# Patient Record
Sex: Male | Born: 1970 | Race: Black or African American | Hispanic: No | Marital: Single | Smoking: Former smoker
Health system: Southern US, Community
[De-identification: ages and names within clinical notes are randomized; demographics above are authoritative.]

## PROBLEM LIST (undated history)

## (undated) DIAGNOSIS — R001 Bradycardia, unspecified: Secondary | ICD-10-CM

## (undated) DIAGNOSIS — M1990 Unspecified osteoarthritis, unspecified site: Secondary | ICD-10-CM

## (undated) DIAGNOSIS — H409 Unspecified glaucoma: Secondary | ICD-10-CM

## (undated) DIAGNOSIS — R519 Headache, unspecified: Secondary | ICD-10-CM

## (undated) DIAGNOSIS — K219 Gastro-esophageal reflux disease without esophagitis: Secondary | ICD-10-CM

## (undated) HISTORY — PX: EYE SURGERY: SHX253

## (undated) HISTORY — PX: FRACTURE SURGERY: SHX138

---

## 2004-09-06 ENCOUNTER — Emergency Department: Payer: Self-pay | Admitting: Emergency Medicine

## 2004-09-25 ENCOUNTER — Ambulatory Visit: Payer: Self-pay

## 2006-04-07 ENCOUNTER — Emergency Department: Payer: Self-pay | Admitting: Emergency Medicine

## 2007-03-22 ENCOUNTER — Other Ambulatory Visit: Payer: Self-pay

## 2007-03-22 ENCOUNTER — Emergency Department: Payer: Self-pay | Admitting: Internal Medicine

## 2007-04-02 ENCOUNTER — Emergency Department: Payer: Self-pay | Admitting: Unknown Physician Specialty

## 2007-12-24 ENCOUNTER — Emergency Department: Payer: Self-pay | Admitting: Emergency Medicine

## 2008-02-02 ENCOUNTER — Other Ambulatory Visit: Payer: Self-pay

## 2008-02-02 ENCOUNTER — Emergency Department: Payer: Self-pay | Admitting: Emergency Medicine

## 2012-12-03 ENCOUNTER — Emergency Department: Payer: Self-pay | Admitting: Internal Medicine

## 2013-05-03 ENCOUNTER — Emergency Department: Payer: Self-pay | Admitting: Emergency Medicine

## 2014-08-02 ENCOUNTER — Emergency Department: Payer: Self-pay | Admitting: Emergency Medicine

## 2014-08-02 LAB — BASIC METABOLIC PANEL
ANION GAP: 8 (ref 7–16)
BUN: 17 mg/dL
CALCIUM: 9.3 mg/dL
Chloride: 108 mmol/L
Co2: 24 mmol/L
Creatinine: 1.25 mg/dL — ABNORMAL HIGH
EGFR (Non-African Amer.): >60
Glucose: 107 mg/dL — ABNORMAL HIGH
Potassium: 3.9 mmol/L
Sodium: 140 mmol/L

## 2014-08-02 LAB — CBC
HCT: 42.7 % (ref 40.0–52.0)
HGB: 14.2 g/dL (ref 13.0–18.0)
MCH: 29.9 pg (ref 26.0–34.0)
MCHC: 33.2 g/dL (ref 32.0–36.0)
MCV: 90 fL (ref 80–100)
Platelet: 165 10*3/uL (ref 150–440)
RBC: 4.73 10*6/uL (ref 4.40–5.90)
RDW: 14.5 % (ref 11.5–14.5)
WBC: 5 10*3/uL (ref 3.8–10.6)

## 2014-08-02 LAB — TROPONIN I

## 2014-08-02 LAB — PRO B NATRIURETIC PEPTIDE: B-Type Natriuretic Peptide: 39 pg/mL

## 2015-05-20 ENCOUNTER — Encounter: Payer: Self-pay | Admitting: Emergency Medicine

## 2015-05-20 ENCOUNTER — Emergency Department
Admission: EM | Admit: 2015-05-20 | Discharge: 2015-05-20 | Disposition: A | Discharge status: Home / Self Care | Payer: BLUE CROSS/BLUE SHIELD | Attending: Emergency Medicine | Admitting: Emergency Medicine

## 2015-05-20 DIAGNOSIS — X58XXXA Exposure to other specified factors, initial encounter: Secondary | ICD-10-CM | POA: Diagnosis not present

## 2015-05-20 DIAGNOSIS — Y9289 Other specified places as the place of occurrence of the external cause: Secondary | ICD-10-CM | POA: Insufficient documentation

## 2015-05-20 DIAGNOSIS — S39012A Strain of muscle, fascia and tendon of lower back, initial encounter: Secondary | ICD-10-CM

## 2015-05-20 DIAGNOSIS — Y998 Other external cause status: Secondary | ICD-10-CM | POA: Diagnosis not present

## 2015-05-20 DIAGNOSIS — Z87891 Personal history of nicotine dependence: Secondary | ICD-10-CM | POA: Insufficient documentation

## 2015-05-20 DIAGNOSIS — Y9389 Activity, other specified: Secondary | ICD-10-CM | POA: Insufficient documentation

## 2015-05-20 DIAGNOSIS — S3992XA Unspecified injury of lower back, initial encounter: Secondary | ICD-10-CM | POA: Diagnosis present

## 2015-05-20 MED ORDER — IBUPROFEN 800 MG PO TABS: 800.0000 mg | ORAL_TABLET | Freq: Three times a day (TID) | ORAL | Status: DC | PRN

## 2015-05-20 MED ORDER — OXYCODONE-ACETAMINOPHEN 5-325 MG PO TABS
1.0000 | ORAL_TABLET | Freq: Once | ORAL | Status: AC
Start: 2015-05-20 — End: 2015-05-20
  Administered 2015-05-20: 1 via ORAL
  Filled 2015-05-20: qty 1

## 2015-05-20 MED ORDER — METHOCARBAMOL 500 MG PO TABS
1000.0000 mg | ORAL_TABLET | Freq: Once | ORAL | Status: AC
  Administered 2015-05-20: 1000 mg via ORAL
  Filled 2015-05-20: qty 2

## 2015-05-20 MED ORDER — IBUPROFEN 800 MG PO TABS
800.0000 mg | ORAL_TABLET | Freq: Once | ORAL | Status: AC
  Administered 2015-05-20: 800 mg via ORAL
  Filled 2015-05-20: qty 1

## 2015-05-20 MED ORDER — METHOCARBAMOL 750 MG PO TABS: 1500.0000 mg | ORAL_TABLET | Freq: Four times a day (QID) | ORAL | Status: DC

## 2015-05-20 MED ORDER — OXYCODONE-ACETAMINOPHEN 5-325 MG PO TABS: 1.0000 | ORAL_TABLET | Freq: Four times a day (QID) | ORAL | Status: DC | PRN

## 2015-05-20 NOTE — ED Provider Notes (Signed)
[facility] Emergency Department Provider Note  ____________________________________________  Time seen: Approximately 10:02 AM  I have reviewed the triage vital signs and the nursing notes.   HISTORY  Chief Complaint Back Pain    HPI Maurice Perez is a 45 y.o. male patient's complaining low back pain persisting muscle spasm bilateral lumbar area. Incident occurred approximately 9 hours ago. He denies any provocative incident for this complaint. Patient denies any radicular component to this pain he denies any bladder or bowel dysfunction. Patient stated intermittent episodes over the past 3 years last episode approximately 6 months ago. Patient states no acute findings on imaging done in the past year. No palliative measures taken for this complaint. Patient is rating his pain as a 10 over 10 describe the pain as "spasmatic".  History reviewed. No pertinent past medical history.  There are no active problems to display for this patient.   History reviewed. No pertinent past surgical history.  Current Outpatient Rx  Name  Route  Sig  Dispense  Refill  . ibuprofen (ADVIL,MOTRIN) 800 MG tablet   Oral   Take 1 tablet (800 mg total) by mouth every 8 (eight) hours as needed.   30 tablet   0   . methocarbamol (ROBAXIN-750) 750 MG tablet   Oral   Take 2 tablets (1,500 mg total) by mouth 4 (four) times daily.   40 tablet   0   . oxyCODONE-acetaminophen (ROXICET) 5-325 MG tablet   Oral   Take 1 tablet by mouth every 6 (six) hours as needed for moderate pain.   12 tablet   0     Allergies Review of patient's allergies indicates no known allergies.  History reviewed. No pertinent family history.  Social History Social History  Substance Use Topics  . Smoking status: Former Games developer  . Smokeless tobacco: Never Used  . Alcohol Use: No    Review of Systems Constitutional: No fever/chills Eyes: No visual changes. ENT: No sore  throat. Cardiovascular: Denies chest pain. Respiratory: Denies shortness of breath. Gastrointestinal: No abdominal pain.  No nausea, no vomiting.  No diarrhea.  No constipation. Genitourinary: Negative for dysuria. Musculoskeletal: Positive for back pain. Skin: Negative for rash. Neurological: Negative for headaches, focal weakness or numbness. 10-point ROS otherwise negative.  ____________________________________________   PHYSICAL EXAM:  VITAL SIGNS: ED Triage Vitals  Enc Vitals Group     BP 05/20/15 0912 139/69 mmHg     Pulse Rate 05/20/15 0912 84     Resp 05/20/15 0912 20     Temp 05/20/15 0912 97.6 F (36.4 C)     Temp Source 05/20/15 0912 Oral     SpO2 05/20/15 0912 100 %     Weight 05/20/15 0912 150 lb (68.04 kg)     Height 05/20/15 0912 5\' 5"  (1.651 m)     Head Cir --      Peak Flow --      Pain Score 05/20/15 0913 10     Pain Loc --      Pain Edu? --      Excl. in GC? --     Constitutional: Alert and oriented. Well appearing and in no acute distress. Eyes: Conjunctivae are normal. PERRL. EOMI. Head: Atraumatic. Nose: No congestion/rhinnorhea. Mouth/Throat: Mucous membranes are moist.  Oropharynx non-erythematous. Neck: No stridor.  No cervical spine tenderness to palpation. Hematological/Lymphatic/Immunilogical: No cervical lymphadenopathy. Cardiovascular: Normal rate, regular rhythm. Grossly normal heart sounds.  Good peripheral circulation. Respiratory: Normal respiratory effort.  No  retractions. Lungs CTAB. Gastrointestinal: Soft and nontender. No distention. No abdominal bruits. No CVA tenderness. Musculoskeletal: No obvious spinal deformity. Mild guarding palpation spinal processes. No CVA guarding. Patient decreased range of motion with flexion. Upon returning to extension palpable paraspinal muscle spasm bilaterally. Patient has negative straight leg test. Neurologic:  Normal speech and language. No gross focal neurologic deficits are appreciated. No gait  instability. Skin:  Skin is warm, dry and intact. No rash noted. Psychiatric: Mood and affect are normal. Speech and behavior are normal.  ____________________________________________   LABS (all labs ordered are listed, but only abnormal results are displayed)  Labs Reviewed - No data to display ____________________________________________  EKG   ____________________________________________  RADIOLOGY   ____________________________________________   PROCEDURES  Procedure(s) performed: None  Critical Care performed: No  ____________________________________________   INITIAL IMPRESSION / ASSESSMENT AND PLAN / ED COURSE  Pertinent labs & imaging results that were available during my care of the patient were reviewed by me and considered in my medical decision making (see chart for details).  Acute lumbar strain. Patient given discharge care instructions.Given a work note for 2 days. ____________________________________________   FINAL CLINICAL IMPRESSION(S) / ED DIAGNOSES  Final diagnoses:  Lumbar strain, initial encounter      Joni ReiningRonald K Sergio Hobart, PA-C 05/20/15 1019  Emily FilbertJonathan E Williams, MD 05/20/15 1024

## 2015-05-20 NOTE — ED Notes (Signed)
States he is having lower back pain since early am  Denies any injury.. And states pain is non radiating  Hx of same

## 2015-05-20 NOTE — ED Notes (Signed)
Pt to triage c/o back pain that started at 0100 today. Pt has chronic back pain. Denies injury.

## 2015-05-21 ENCOUNTER — Emergency Department: Payer: BLUE CROSS/BLUE SHIELD

## 2015-05-21 ENCOUNTER — Encounter: Payer: Self-pay | Admitting: Emergency Medicine

## 2015-05-21 ENCOUNTER — Emergency Department
Admission: EM | Admit: 2015-05-21 | Discharge: 2015-05-21 | Disposition: A | Discharge status: Home / Self Care | Payer: BLUE CROSS/BLUE SHIELD | Attending: Emergency Medicine | Admitting: Emergency Medicine

## 2015-05-21 DIAGNOSIS — Y9389 Activity, other specified: Secondary | ICD-10-CM | POA: Insufficient documentation

## 2015-05-21 DIAGNOSIS — Y998 Other external cause status: Secondary | ICD-10-CM | POA: Insufficient documentation

## 2015-05-21 DIAGNOSIS — M47816 Spondylosis without myelopathy or radiculopathy, lumbar region: Secondary | ICD-10-CM | POA: Diagnosis not present

## 2015-05-21 DIAGNOSIS — Z79899 Other long term (current) drug therapy: Secondary | ICD-10-CM | POA: Insufficient documentation

## 2015-05-21 DIAGNOSIS — X58XXXA Exposure to other specified factors, initial encounter: Secondary | ICD-10-CM | POA: Diagnosis not present

## 2015-05-21 DIAGNOSIS — Y9289 Other specified places as the place of occurrence of the external cause: Secondary | ICD-10-CM | POA: Diagnosis not present

## 2015-05-21 DIAGNOSIS — S39012A Strain of muscle, fascia and tendon of lower back, initial encounter: Secondary | ICD-10-CM | POA: Insufficient documentation

## 2015-05-21 DIAGNOSIS — Z87891 Personal history of nicotine dependence: Secondary | ICD-10-CM | POA: Insufficient documentation

## 2015-05-21 DIAGNOSIS — M545 Low back pain: Secondary | ICD-10-CM | POA: Diagnosis present

## 2015-05-21 NOTE — ED Notes (Signed)
Patient seen here yesterday for lower back pain. Returns this AM with same complaint, states that he "hurt all night" and that pain medication is not helping him. Denies any new symptoms. Pain radiates down left leg.

## 2015-05-21 NOTE — ED Notes (Signed)
Was seen for lower back pain yesterday. Now states is worse and radiates into right leg

## 2015-05-21 NOTE — ED Provider Notes (Signed)
CSN: [id number]     Arrival date & time 05/21/15  1007 History   First MD Initiated Contact with Patient 05/21/15 1039     Chief Complaint  Patient presents with  . Back Pain    HPI Comments: 45 year old male presents today complaining of lower back pain radiating down both legs. Has not had any tingling, numbness or weakness down his legs. No loss of bowel or bladder function, genital or perianal numbness. Seen here yesterday for the same and prescribed motrin, robaxin and percocet without relief. Does not have a history of back problems and has never had a surgery. This pain started about 3 days ago without an injury/   The history is provided by the patient.    History reviewed. No pertinent past medical history. History reviewed. No pertinent past surgical history. No family history on file. Social History  Substance Use Topics  . Smoking status: Former Games developermoker  . Smokeless tobacco: Never Used  . Alcohol Use: No    Review of Systems  Musculoskeletal: Positive for myalgias, back pain and arthralgias. Negative for gait problem.  Neurological: Negative for weakness and numbness.  All other systems reviewed and are negative.     Allergies  Review of patient's allergies indicates no known allergies.  Home Medications   Prior to Admission medications   Medication Sig Start Date End Date Taking? Authorizing Provider  ibuprofen (ADVIL,MOTRIN) 800 MG tablet Take 1 tablet (800 mg total) by mouth every 8 (eight) hours as needed. 05/20/15   Joni Reiningonald K Smith, PA-C  methocarbamol (ROBAXIN-750) 750 MG tablet Take 2 tablets (1,500 mg total) by mouth 4 (four) times daily. 05/20/15   Joni Reiningonald K Smith, PA-C  oxyCODONE-acetaminophen (ROXICET) 5-325 MG tablet Take 1 tablet by mouth every 6 (six) hours as needed for moderate pain. 05/20/15   Joni Reiningonald K Smith, PA-C   BP 120/63 mmHg  Pulse 54  Temp(Src) 98.1 F (36.7 C) (Oral)  Resp 12  Ht 5\' 5"  (1.651 m)  Wt 68.04 kg  BMI 24.96 kg/m2  SpO2  98% Physical Exam  Constitutional: He is oriented to person, place, and time. Vital signs are normal. He appears well-developed and well-nourished. He is active.  Non-toxic appearance. He does not have a sickly appearance. He does not appear ill.  HENT:  Head: Normocephalic and atraumatic.  Neck: Normal range of motion. Neck supple.  Musculoskeletal:       Right hip: Normal. He exhibits normal strength and no tenderness.       Left hip: Normal. He exhibits normal strength and no tenderness.       Lumbar back: He exhibits tenderness and spasm.  Neurological: He is alert and oriented to person, place, and time. He has normal strength and normal reflexes. He displays normal reflexes. No sensory deficit. He exhibits normal muscle tone. Gait normal.  Reflex Scores:      Patellar reflexes are 2+ on the right side and 2+ on the left side. No ataxia  Negative SLr bilaterally   Skin: Skin is warm and dry.  Psychiatric: He has a normal mood and affect. His behavior is normal. Thought content normal.  Nursing note and vitals reviewed.   ED Course  Procedures (including critical care time) Labs Review Labs Reviewed - No data to display  Imaging Review Dg Lumbar Spine 2-3 Views  05/21/2015  CLINICAL DATA:  Lower back pain for 2 days traveling down both legs. EXAM: LUMBAR SPINE - 2-3 VIEW COMPARISON:  12/03/2012 FINDINGS:  No evidence of fracture, focal bone lesion, or endplate erosion. Stable spondylotic endplate spurring with mild L1-2 and L4-5 disc narrowing. No subluxation or visible facet arthropathy. IMPRESSION: No acute finding or change from 2014. Electronically Signed   By: Marnee Spring M.D.   On: 05/21/2015 11:44   I have personally reviewed and evaluated these images and lab results as part of my medical decision-making.   EKG Interpretation None      MDM  Reviewed notes from yesterday - non traumatic back pain , taking meds without relief. Will perform XRAY today. No red flag  warning signs to indicate need for MRI   XRAY as above, unchanged from prior - continue current meds follow up for new or worsening symptoms  Final diagnoses:  Lumbar strain, initial encounter  Arthritis, lumbar spine        Christella Scheuermann, PA-C 05/21/15 1153  Minna Antis, MD 05/21/15 1530

## 2016-08-30 ENCOUNTER — Encounter: Payer: Self-pay | Admitting: Emergency Medicine

## 2016-08-30 ENCOUNTER — Emergency Department
Admission: EM | Admit: 2016-08-30 | Discharge: 2016-08-30 | Disposition: A | Discharge status: Home / Self Care | Payer: BLUE CROSS/BLUE SHIELD | Attending: Emergency Medicine | Admitting: Emergency Medicine

## 2016-08-30 DIAGNOSIS — Z87891 Personal history of nicotine dependence: Secondary | ICD-10-CM | POA: Insufficient documentation

## 2016-08-30 DIAGNOSIS — Z79899 Other long term (current) drug therapy: Secondary | ICD-10-CM | POA: Diagnosis not present

## 2016-08-30 DIAGNOSIS — J029 Acute pharyngitis, unspecified: Secondary | ICD-10-CM | POA: Diagnosis not present

## 2016-08-30 LAB — POCT RAPID STREP A: STREPTOCOCCUS, GROUP A SCREEN (DIRECT): NEGATIVE

## 2016-08-30 MED ORDER — AMOXICILLIN 500 MG PO CAPS
500.0000 mg | ORAL_CAPSULE | Freq: Once | ORAL | Status: AC
  Administered 2016-08-30: 500 mg via ORAL
  Filled 2016-08-30: qty 1

## 2016-08-30 MED ORDER — AMOXICILLIN 500 MG PO TABS: 500.0000 mg | ORAL_TABLET | Freq: Three times a day (TID) | ORAL | 0 refills | Status: DC

## 2016-08-30 NOTE — ED Provider Notes (Signed)
[facility] Emergency Department Provider Note  ____________________________________________  Time seen: Approximately 7:28 PM  I have reviewed the triage vital signs and the nursing notes.   HISTORY  Chief Complaint Sore Throat    HPI Maurice Perez is a 46 y.o. male who presents to the emergency department for evaluation of sore throat x 3 days. He reports chills without known fever. He has taken tylenol without relief.  History reviewed. No pertinent past medical history.  There are no active problems to display for this patient.   Past Surgical History:  Procedure Laterality Date  . FRACTURE SURGERY Left    LLE    Prior to Admission medications   Medication Sig Start Date End Date Taking? Authorizing Provider  amoxicillin (AMOXIL) 500 MG tablet Take 1 tablet (500 mg total) by mouth 3 (three) times daily. 08/30/16   Chinita Pester, FNP  ibuprofen (ADVIL,MOTRIN) 800 MG tablet Take 1 tablet (800 mg total) by mouth every 8 (eight) hours as needed. 05/20/15   Joni Reining, PA-C  methocarbamol (ROBAXIN-750) 750 MG tablet Take 2 tablets (1,500 mg total) by mouth 4 (four) times daily. 05/20/15   Joni Reining, PA-C  oxyCODONE-acetaminophen (ROXICET) 5-325 MG tablet Take 1 tablet by mouth every 6 (six) hours as needed for moderate pain. 05/20/15   Joni Reining, PA-C    Allergies Patient has no known allergies.  History reviewed. No pertinent family history.  Social History Social History  Substance Use Topics  . Smoking status: Former Games developer  . Smokeless tobacco: Never Used  . Alcohol use No    Review of Systems Constitutional: Negative for fever. Positive for chills. Eyes: No visual changes. ENT: Positive for sore throat; Negative for difficulty swallowing. Respiratory: Denies shortness of breath. Gastrointestinal: No abdominal pain.  No nausea, no vomiting.  No diarrhea.  Genitourinary: Negative for dysuria. Musculoskeletal: Positive for  generalized body aches. Skin: Negative for rash. Neurological: Negative for headaches, focal weakness or numbness.  ____________________________________________   PHYSICAL EXAM:  VITAL SIGNS: ED Triage Vitals [08/30/16 1835]  Enc Vitals Group     BP 128/79     Pulse Rate 90     Resp 18     Temp 97.7 F (36.5 C)     Temp Source Oral     SpO2 97 %     Weight 160 lb (72.6 kg)     Height  (1.651 m)     Head Circumference      Peak Flow      Pain Score 7     Pain Loc      Pain Edu?      Excl. in GC?    Constitutional: Alert and oriented. Well appearing and in no acute distress. Eyes: Conjunctivae are normal. PERRL. EOMI. Head: Atraumatic. Nose: No congestion/rhinnorhea. Mouth/Throat: Mucous membranes are moist.  Oropharynx erythematous, tonsils 2+ with exudate. Neck: No stridor.  Lymphatic: Lymphadenopathy: bilateral anterior cervical lymphadenopathy on exam. Cardiovascular: Normal rate, regular rhythm. Good peripheral circulation. Respiratory: Normal respiratory effort. Lungs CTAB. Gastrointestinal: Soft and nontender. Musculoskeletal: No lower extremity tenderness nor edema.   Neurologic:  Normal speech and language. No gross focal neurologic deficits are appreciated. Speech is normal. No gait instability. Skin:  Skin is warm, dry and intact. No rash noted Psychiatric: Mood and affect are normal. Speech and behavior are normal.  ____________________________________________   LABS (all labs ordered are listed, but only abnormal results are displayed)  Labs Reviewed  POCT  RAPID STREP A   ____________________________________________  EKG   ____________________________________________  RADIOLOGY   ____________________________________________   PROCEDURES  Procedure(s) performed: None  Critical Care performed: No ____________________________________________   INITIAL IMPRESSION / ASSESSMENT AND PLAN / ED COURSE  Pertinent labs & imaging results  that were available during my care of the patient were reviewed by me and considered in my medical decision making (see chart for details).  46 year old male presenting to the emergency department for sore throat for the past 3 days. No relief with Tylenol or ibuprofen. He will be treated for pharyngitis with amoxicillin and advised to rinse with warm salt water and continue Tylenol and ibuprofen. He'll be instructed to follow-up with his primary care provider for symptoms that are not improving over the next 3 days. He was instructed to return to the emergency department for symptoms that change or worsen if he is unable schedule an appointment. ____________________________________________  [facility] Prescriptions   AMOXICILLIN (AMOXIL) 500 MG TABLET    Take 1 tablet (500 mg total) by mouth 3 (three) times daily.    FINAL CLINICAL IMPRESSION(S) / ED DIAGNOSES  Final diagnoses:  Exudative pharyngitis    If controlled substance prescribed during this visit, 12 month history viewed on the [place] prior to issuing an initial prescription for Schedule II or III opiod.   Note:  This document was prepared using Dragon voice recognition software and may include unintentional dictation errors.    Chinita Pester, FNP 08/30/16 1938    Emily Filbert, MD 08/30/16 2008

## 2016-08-30 NOTE — Discharge Instructions (Signed)
Follow up with the primary care provider of your choice for symptoms that are not improving over the next few days.  Return to the ER for symptoms that change or worsen if unable to schedule an appointment. 

## 2016-08-30 NOTE — ED Triage Notes (Signed)
Pt c/o sore throat x 3 days

## 2016-11-13 ENCOUNTER — Emergency Department
Admission: EM | Admit: 2016-11-13 | Discharge: 2016-11-13 | Disposition: A | Discharge status: Home / Self Care | Payer: BLUE CROSS/BLUE SHIELD | Attending: Emergency Medicine | Admitting: Emergency Medicine

## 2016-11-13 DIAGNOSIS — M5412 Radiculopathy, cervical region: Secondary | ICD-10-CM | POA: Diagnosis not present

## 2016-11-13 DIAGNOSIS — M542 Cervicalgia: Secondary | ICD-10-CM | POA: Diagnosis present

## 2016-11-13 DIAGNOSIS — Z87891 Personal history of nicotine dependence: Secondary | ICD-10-CM | POA: Insufficient documentation

## 2016-11-13 MED ORDER — PREDNISONE 50 MG PO TABS
ORAL_TABLET | ORAL | 0 refills | Status: DC
Start: 2016-11-13 — End: 2017-07-06

## 2016-11-13 MED ORDER — METHYLPREDNISOLONE SODIUM SUCC 125 MG IJ SOLR
125.0000 mg | Freq: Once | INTRAMUSCULAR | Status: AC
  Administered 2016-11-13: 125 mg via INTRAMUSCULAR
  Filled 2016-11-13: qty 2

## 2016-11-13 MED ORDER — ORPHENADRINE CITRATE 30 MG/ML IJ SOLN
60.0000 mg | Freq: Two times a day (BID) | INTRAMUSCULAR | Status: DC
  Administered 2016-11-13: 60 mg via INTRAMUSCULAR
  Filled 2016-11-13: qty 2

## 2016-11-13 NOTE — ED Provider Notes (Signed)
[facility] Emergency Department Provider Note  ____________________________________________  Time seen: Approximately 2:03 PM  I have reviewed the triage vital signs and the nursing notes.   HISTORY  Chief Complaint Shoulder Pain    HPI Maurice Perez is a 46 y.o. male presenting to the emergency department with cervical radiculopathy for approximately 1 month. Patient denies falls or incidences of trauma. Patient states that he experiences radiculopathy of the bilateral extremities that radiates to the wrists bilaterally. Pain is reproduced with range of motion at the neck. Patient denies weakness or changes in sensation of the upper extremities bilaterally. No prior surgeries. No alleviating measures have been attempted.    History reviewed. No pertinent past medical history.  There are no active problems to display for this patient.   Past Surgical History:  Procedure Laterality Date  . FRACTURE SURGERY Left    LLE    Prior to Admission medications   Medication Sig Start Date End Date Taking? Authorizing Provider  amoxicillin (AMOXIL) 500 MG tablet Take 1 tablet (500 mg total) by mouth 3 (three) times daily. 08/30/16   Triplett, Rulon Eisenmengerari B, FNP  ibuprofen (ADVIL,MOTRIN) 800 MG tablet Take 1 tablet (800 mg total) by mouth every 8 (eight) hours as needed. 05/20/15   Joni ReiningSmith, Ronald K, PA-C  methocarbamol (ROBAXIN-750) 750 MG tablet Take 2 tablets (1,500 mg total) by mouth 4 (four) times daily. 05/20/15   Joni ReiningSmith, Ronald K, PA-C  oxyCODONE-acetaminophen (ROXICET) 5-325 MG tablet Take 1 tablet by mouth every 6 (six) hours as needed for moderate pain. 05/20/15   Joni ReiningSmith, Ronald K, PA-C  predniSONE (DELTASONE) 50 MG tablet Take one tablet by mouth daily for the next five days. 11/13/16   Orvil FeilWoods, Avey Mcmanamon M, PA-C    Allergies Patient has no known allergies.  No family history on file.  Social History Social History  Substance Use Topics  . Smoking status: Former Games developermoker   . Smokeless tobacco: Never Used  . Alcohol use No     Review of Systems  Constitutional: No fever/chills Eyes: No visual changes. No discharge ENT: No upper respiratory complaints. Cardiovascular: no chest pain. Respiratory: no cough. No SOB. Gastrointestinal: No abdominal pain.  No nausea, no vomiting.  No diarrhea.  No constipation. Musculoskeletal: Negative for musculoskeletal pain. Skin: Negative for rash, abrasions, lacerations, ecchymosis. Neurological: Patient has cervical radiculopathy.   ____________________________________________   PHYSICAL EXAM:  VITAL SIGNS: ED Triage Vitals  Enc Vitals Group     BP 11/13/16 1249 125/75     Pulse Rate 11/13/16 1249 90     Resp 11/13/16 1249 15     Temp 11/13/16 1249 97.7 F (36.5 C)     Temp Source 11/13/16 1249 Oral     SpO2 11/13/16 1249 96 %     Weight 11/13/16 1246 160 lb (72.6 kg)     Height 11/13/16 1246 5\' 4"  (1.626 m)     Head Circumference --      Peak Flow --      Pain Score 11/13/16 1246 10     Pain Loc --      Pain Edu? --      Excl. in GC? --      Constitutional: Alert and oriented. Patient is talkative and engaged.  Eyes: Palpebral and bulbar conjunctiva are nonerythematous bilaterally. PERRL. EOMI.  Head: Atraumatic. Neck: Full range of motion. No pain with neck flexion. No pain with palpation of the cervical spine.  Cardiovascular: No pain with palpation over the  anterior and posterior chest wall. Normal rate, regular rhythm. Normal S1 and S2. No murmurs, gallops or rubs auscultated.  Respiratory: Trachea is midline. Resonant and symmetric percussion tones bilaterally. On auscultation, adventitious sounds are absent.  Musculoskeletal: Patient has 5/5 strength in the upper and lower extremities bilaterally. Full range of motion at the shoulder, elbow and wrist bilaterally. Full range of motion at the hip, knee and ankle bilaterally. No changes in gait. Palpable radial, ulnar and dorsalis pedis pulses  bilaterally and symmetrically. Neurologic: Normal speech and language. No gross focal neurologic deficits are appreciated. Cranial nerves: 2-10 normal as tested. Cerebellar: Finger-nose-finger WNL, heel to shin WNL. Sensorimotor: No sensory loss or abnormal reflexes. Vision: No visual field deficts noted to confrontation.  Speech: No dysarthria or expressive aphasia.  Skin:  Skin is warm, dry and intact. No rash or bruising noted.  Psychiatric: Mood and affect are normal for age. Speech and behavior are normal.   ____________________________________________   LABS (all labs ordered are listed, but only abnormal results are displayed)  Labs Reviewed - No data to display ____________________________________________  EKG   ____________________________________________  RADIOLOGY   No results found.  ____________________________________________    PROCEDURES  Procedure(s) performed:    Procedures    Medications  orphenadrine (NORFLEX) injection 60 mg (not administered)  methylPREDNISolone sodium succinate (SOLU-MEDROL) 125 mg/2 mL injection 125 mg (not administered)     ____________________________________________   INITIAL IMPRESSION / ASSESSMENT AND PLAN / ED COURSE  Pertinent labs & imaging results that were available during my care of the patient were reviewed by me and considered in my medical decision making (see chart for details).  Review of the [place] CSRS was performed in accordance of the [facility] prior to dispensing any controlled drugs.     Assessment and plan: Cervical radiculopathy: Patient presents the emergency department with cervical radiculopathy for the past 3 weeks. Neurologic exam and overall physical exam was reassuring. Patient was given an injection of Solu-Medrol and Norflex in the emergency department. He was discharged with prednisone. Patient was advised to follow-up with Dr. Marcell Barlow as needed. Vital signs were reassuring prior to  discharge. All patient questions were answered.     ____________________________________________  FINAL CLINICAL IMPRESSION(S) / ED DIAGNOSES  Final diagnoses:  Cervical radiculopathy      NEW MEDICATIONS STARTED DURING THIS VISIT:  New Prescriptions   PREDNISONE (DELTASONE) 50 MG TABLET    Take one tablet by mouth daily for the next five days.        This chart was dictated using voice recognition software/Dragon. Despite best efforts to proofread, errors can occur which can change the meaning. Any change was purely unintentional.    Orvil Feil, PA-C 11/13/16 1411    Sharman Cheek, MD 11/16/16 [phone]

## 2016-11-13 NOTE — ED Notes (Signed)
See triage note presents with pain to both shoulders w/o injury states pain started about 1 month ago no deformity noted

## 2016-11-13 NOTE — ED Triage Notes (Signed)
Pt c/o back across the back of BL shoulders radiating into BL arms for the past month.

## 2016-11-26 ENCOUNTER — Emergency Department
Admission: EM | Admit: 2016-11-26 | Discharge: 2016-11-26 | Disposition: A | Discharge status: Home / Self Care | Payer: BLUE CROSS/BLUE SHIELD | Attending: Emergency Medicine | Admitting: Emergency Medicine

## 2016-11-26 ENCOUNTER — Emergency Department: Payer: BLUE CROSS/BLUE SHIELD

## 2016-11-26 ENCOUNTER — Encounter: Payer: Self-pay | Admitting: *Deleted

## 2016-11-26 DIAGNOSIS — M5412 Radiculopathy, cervical region: Secondary | ICD-10-CM | POA: Diagnosis not present

## 2016-11-26 DIAGNOSIS — Z79899 Other long term (current) drug therapy: Secondary | ICD-10-CM | POA: Diagnosis not present

## 2016-11-26 DIAGNOSIS — M25512 Pain in left shoulder: Secondary | ICD-10-CM | POA: Diagnosis present

## 2016-11-26 DIAGNOSIS — Z87891 Personal history of nicotine dependence: Secondary | ICD-10-CM | POA: Insufficient documentation

## 2016-11-26 MED ORDER — MELOXICAM 15 MG PO TABS
15.0000 mg | ORAL_TABLET | Freq: Every day | ORAL | 0 refills | Status: DC
Start: 2016-11-26 — End: 2017-07-06

## 2016-11-26 MED ORDER — CYCLOBENZAPRINE HCL 10 MG PO TABS: 10.0000 mg | ORAL_TABLET | Freq: Three times a day (TID) | ORAL | 0 refills | Status: DC | PRN

## 2016-11-26 NOTE — ED Provider Notes (Signed)
[facility] Emergency Department Provider Note  ____________________________________________  Time seen: Approximately 8:57 PM  I have reviewed the triage vital signs and the nursing notes.   HISTORY  Chief Complaint Shoulder Pain    HPI Maurice Perez is a 46 y.o. male who returns to the emergency department complaining of continued left shoulder pain. Patient was seen in this department 10 days prior for similar complaint. Patient was diagnosed with cervical radiculopathy and put on prednisone. Patient reports that there was mild improvement but after finishing steroids pain has returned. Patient reports that he has had no definitive injury but does do repetitive heavy lifting at his job. Patient denies any neck pain, elbow pain. No direct trauma. Patient endorses mild radicular symptoms down the left arm with intermittent numbness and tingling in his hand. He denies any headache, visual changes, neck pain or stiffness, chest pain, shortness of breath, abdominal pain, nausea vomiting. No other medications other than prednisone have been taken for this complaint.   History reviewed. No pertinent past medical history.  There are no active problems to display for this patient.   Past Surgical History:  Procedure Laterality Date  . FRACTURE SURGERY Left    LLE    Prior to Admission medications   Medication Sig Start Date End Date Taking? Authorizing Provider  amoxicillin (AMOXIL) 500 MG tablet Take 1 tablet (500 mg total) by mouth 3 (three) times daily. 08/30/16   Triplett, Rulon Eisenmengerari B, FNP  cyclobenzaprine (FLEXERIL) 10 MG tablet Take 1 tablet (10 mg total) by mouth 3 (three) times daily as needed for muscle spasms. 11/26/16   Cuthriell, Delorise RoyalsJonathan D, PA-C  ibuprofen (ADVIL,MOTRIN) 800 MG tablet Take 1 tablet (800 mg total) by mouth every 8 (eight) hours as needed. 05/20/15   Joni ReiningSmith, Ronald K, PA-C  meloxicam (MOBIC) 15 MG tablet Take 1 tablet (15 mg total) by mouth  daily. 11/26/16   Cuthriell, Delorise RoyalsJonathan D, PA-C  methocarbamol (ROBAXIN-750) 750 MG tablet Take 2 tablets (1,500 mg total) by mouth 4 (four) times daily. 05/20/15   Joni ReiningSmith, Ronald K, PA-C  oxyCODONE-acetaminophen (ROXICET) 5-325 MG tablet Take 1 tablet by mouth every 6 (six) hours as needed for moderate pain. 05/20/15   Joni ReiningSmith, Ronald K, PA-C  predniSONE (DELTASONE) 50 MG tablet Take one tablet by mouth daily for the next five days. 11/13/16   Orvil FeilWoods, Jaclyn M, PA-C    Allergies Patient has no known allergies.  No family history on file.  Social History Social History  Substance Use Topics  . Smoking status: Former Games developermoker  . Smokeless tobacco: Never Used  . Alcohol use No     Review of Systems  Constitutional: No fever/chills Eyes: No visual changes.  Cardiovascular: no chest pain. Respiratory: no cough. No SOB. Gastrointestinal: No abdominal pain.  No nausea, no vomiting.   Musculoskeletal: positive for 1 month of left shoulder pain Skin: Negative for rash, abrasions, lacerations, ecchymosis. Neurological: Negative for headaches, focal weakness or numbness. 10-point ROS otherwise negative.  ____________________________________________   PHYSICAL EXAM:  VITAL SIGNS: ED Triage Vitals  Enc Vitals Group     BP 11/26/16 2050 119/71     Pulse Rate 11/26/16 2050 (!) 52     Resp 11/26/16 2050 20     Temp 11/26/16 2050 98.5 F (36.9 C)     Temp Source 11/26/16 2050 Oral     SpO2 11/26/16 2050 100 %     Weight 11/26/16 2051 160 lb (72.6 kg)  Height 11/26/16 2051 5\' 4"  (1.626 m)     Head Circumference --      Peak Flow --      Pain Score 11/26/16 2050 10     Pain Loc --      Pain Edu? --      Excl. in GC? --      Constitutional: Alert and oriented. Well appearing and in no acute distress. Eyes: Conjunctivae are normal. PERRL. EOMI. Head: Atraumatic. Neck: No stridor.  No cervical spine tenderness to palpation. Neck is supple with full range of motion. Radial pulse intact  bilateral upper extremities. Sensation intact and equal bilateral upper extremity in all dermatomal distribution.  Cardiovascular: Normal rate, regular rhythm. Normal S1 and S2.  Good peripheral circulation. Respiratory: Normal respiratory effort without tachypnea or retractions. Lungs CTAB. Good air entry to the bases with no decreased or absent breath sounds. Musculoskeletal: Full range of motion to all extremities. No gross deformities appreciated.no deformities of the left shoulder noted. Full range of motion to the left shoulder. Patient is moderately tender to palpation along the scapularspine. No palpable abnormality. No specific point tenderness. Patient is also mildly tender to palpation over the [facility] joint and lateral joint space over the humeral head. No palpable abnormality. Examination of the elbow was unremarkable. Radial pulse intact. Sensation intact distally.. Neurologic:  Normal speech and language. No gross focal neurologic deficits are appreciated.  Skin:  Skin is warm, dry and intact. No rash noted. Psychiatric: Mood and affect are normal. Speech and behavior are normal. Patient exhibits appropriate insight and judgement.   ____________________________________________   LABS (all labs ordered are listed, but only abnormal results are displayed)  Labs Reviewed - No data to display ____________________________________________  EKG   ____________________________________________  RADIOLOGY Festus Barren Cuthriell, personally viewed and evaluated these images (plain radiographs) as part of my medical decision making, as well as reviewing the written report by the radiologist.  Dg Cervical Spine 2-3 Views  Result Date: 11/26/2016 CLINICAL DATA:  Shoulder pain x1 month with mild radiculopathy down left arm. EXAM: CERVICAL SPINE - 2-3 VIEW COMPARISON:  None. FINDINGS: Mild disc space narrowing noted from C2 through C5 and at C6-7 with bulky anterior osteophytes off of the C3 and  C4 vertebral bodies and to a lesser degree C5 and C6. No acute cervical spine fracture or subluxation. Uncovertebral joint spurring is noted bilaterally at C2-3 and to a greater extent C3-4 and C4-5. The C1-C2 relationship is maintained. The atlantodental interval and craniocervical relationship appear intact. IMPRESSION: 1. Mild multilevel disc space narrowing of the cervical spine with bulky anterior osteophytes more so off the anterior aspect of C3 and C4. 2. 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 3. No acute fracture or subluxations. 4. No significant prevertebral soft tissue swelling. Electronically Signed   By: Tollie Eth M.D.   On: 11/26/2016 21:33   Dg Shoulder Left  Result Date: 11/26/2016 CLINICAL DATA:  Left shoulder pain radiating down left arm x1 month. EXAM: LEFT SHOULDER - 2+ VIEW COMPARISON:  None. FINDINGS: There is no evidence of fracture or dislocation. The acromioclavicular and glenohumeral joints maintained. There is no evidence of arthropathy or other focal bone abnormality. Soft tissues are unremarkable. IMPRESSION: No acute fracture or dislocations about the left shoulder. Electronically Signed   By: Tollie Eth M.D.   On: 11/26/2016 21:34    ____________________________________________    PROCEDURES  Procedure(s) performed:    Procedures  Medications - No data to display   ____________________________________________   INITIAL IMPRESSION / ASSESSMENT AND PLAN / ED COURSE  Pertinent labs & imaging results that were available during my care of the patient were reviewed by me and considered in my medical decision making (see chart for details).  Review of the [place] CSRS was performed in accordance of the [facility] prior to dispensing any controlled drugs.     Patient's diagnosis is consistent with cervical radiculopathy. Patient presents emergency department today's prior to being diagnosed with cervical  radiculopathy. Due to no significant improvement, x-rays were obtained. These returned with reassuring results with no acute osseous abnormality. X-ray of the cervical spine does reveal multilevel degenerative changes.. Patient will be discharged home with prescriptions for anti-inflammatories and muscle relaxer. Patient is to follow up with orthopedics or neurosurgery as needed or otherwise directed. Patient is given ED precautions to return to the ED for any worsening or new symptoms.     ____________________________________________  FINAL CLINICAL IMPRESSION(S) / ED DIAGNOSES  Final diagnoses:  Cervical radiculopathy      NEW MEDICATIONS STARTED DURING THIS VISIT:  New Prescriptions   CYCLOBENZAPRINE (FLEXERIL) 10 MG TABLET    Take 1 tablet (10 mg total) by mouth 3 (three) times daily as needed for muscle spasms.   MELOXICAM (MOBIC) 15 MG TABLET    Take 1 tablet (15 mg total) by mouth daily.        This chart was dictated using voice recognition software/Dragon. Despite best efforts to proofread, errors can occur which can change the meaning. Any change was purely unintentional.    Racheal Patches, PA-C 11/26/16 2149    Governor Rooks, MD 11/28/16 [phone]

## 2016-11-26 NOTE — ED Triage Notes (Signed)
Pt was seen 1 week ago for left shoulder pain, pt reports pain is not any better, pt reports lifting heavy dishes at work, pt denies any other symptoms

## 2017-07-06 ENCOUNTER — Other Ambulatory Visit: Payer: Self-pay

## 2017-07-06 ENCOUNTER — Encounter: Payer: Self-pay | Admitting: Intensive Care

## 2017-07-06 ENCOUNTER — Emergency Department
Admission: EM | Admit: 2017-07-06 | Discharge: 2017-07-06 | Disposition: A | Discharge status: Home / Self Care | Payer: BLUE CROSS/BLUE SHIELD | Attending: Student in an Organized Health Care Education/Training Program | Admitting: Student in an Organized Health Care Education/Training Program

## 2017-07-06 DIAGNOSIS — M5412 Radiculopathy, cervical region: Secondary | ICD-10-CM | POA: Diagnosis not present

## 2017-07-06 DIAGNOSIS — Z87891 Personal history of nicotine dependence: Secondary | ICD-10-CM | POA: Insufficient documentation

## 2017-07-06 DIAGNOSIS — M25512 Pain in left shoulder: Secondary | ICD-10-CM | POA: Diagnosis present

## 2017-07-06 MED ORDER — PREDNISONE 10 MG PO TABS: ORAL_TABLET | ORAL | 0 refills | Status: DC

## 2017-07-06 MED ORDER — KETOROLAC TROMETHAMINE 30 MG/ML IJ SOLN
30.0000 mg | Freq: Once | INTRAMUSCULAR | Status: AC
  Administered 2017-07-06: 30 mg via INTRAMUSCULAR
  Filled 2017-07-06: qty 1

## 2017-07-06 NOTE — Discharge Instructions (Signed)
Follow-up with Dr. Martha ClanKrasinski or orthopedist of your choice if any continued problems. You are given an injection while in the emergency department today for inflammation and pain.  Begin taking the prednisone written for you starting today and continue until finished.

## 2017-07-06 NOTE — ED Provider Notes (Signed)
[facility] Emergency Department Provider Note  ____________________________________________   First MD Initiated Contact with Patient 07/06/17 1644     (approximate)  I have reviewed the triage vital signs and the nursing notes.   HISTORY  Chief Complaint Shoulder Pain (left)   HPI Maurice Perez is a 47 y.o. male is here with complaint of left shoulder pain.  Patient has been seen in the emergency department for the same complaint in the past.  Patient was seen twice in July 2018 at which time he was diagnosed with cervical radiculopathy.  He states that this pain is similar to the pain that he experienced today.  He continues to do repetitive work, he denies any recent injuries.  In the past he has been placed on anti-inflammatories and a muscle relaxant.  He states the muscle relaxant does not work quick enough.  He has not taken any over-the-counter medication for his pain.  Currently rates his pain as 10/10.  History reviewed. No pertinent past medical history.  There are no active problems to display for this patient.   Past Surgical History:  Procedure Laterality Date  . FRACTURE SURGERY Left    LLE    Prior to Admission medications   Medication Sig Start Date End Date Taking? Authorizing Provider  predniSONE (DELTASONE) 10 MG tablet Take 6 tablets  today, on day 2 take 5 tablets, day 3 take 4 tablets, day 4 take 3 tablets, day 5 take  2 tablets and 1 tablet the last day 07/06/17   Tommi RumpsSummers, Dannie Woolen L, PA-C    Allergies Patient has no known allergies.  History reviewed. No pertinent family history.  Social History Social History   Tobacco Use  . Smoking status: Former Smoker    Last attempt to quit: 2016    Years since quitting: 3.1  . Smokeless tobacco: Never Used  Substance Use Topics  . Alcohol use: Yes    Alcohol/week: 12.6 oz    Types: 21 Cans of beer per week  . Drug use: No    Review of Systems Constitutional: No  fever/chills Cardiovascular: Denies chest pain. Respiratory: Denies shortness of breath. Musculoskeletal: Positive for left shoulder pain. Skin: Negative for rash. Neurological: Negative for headaches, focal weakness or numbness.  ____________________________________________   PHYSICAL EXAM:  VITAL SIGNS: ED Triage Vitals  Enc Vitals Group     BP 07/06/17 1620 131/80     Pulse Rate 07/06/17 1620 (!) 48     Resp 07/06/17 1620 20     Temp 07/06/17 1620 (!) 97.5 F (36.4 C)     Temp Source 07/06/17 1620 Oral     SpO2 07/06/17 1620 99 %     Weight 07/06/17 1620 160 lb (72.6 kg)     Height 07/06/17 1620 5\' 5"  (1.651 m)     Head Circumference --      Peak Flow --      Pain Score 07/06/17 1628 10     Pain Loc --      Pain Edu? --      Excl. in GC? --    Constitutional: Alert and oriented. Well appearing and in no acute distress. Eyes: Conjunctivae are normal.  Head: Atraumatic. Neck: No stridor.   Cardiovascular: Normal rate, regular rhythm. Grossly normal heart sounds.  Good peripheral circulation. Respiratory: Normal respiratory effort.  No retractions. Lungs CTAB. Musculoskeletal: On examination of left shoulder there is no gross deformity no soft tissue swelling present.  There is diffuse  tenderness on palpation parascapular muscles.  Range of motion is without crepitus but is restricted secondary to patient's pain especially with abduction.  No evidence of trauma is noted on exam.  There is no point tenderness on palpation of the clavicle.  Minimal tenderness is noted at the [facility] joint. Neurologic:  Normal speech and language. No gross focal neurologic deficits are appreciated. No gait instability. Skin:  Skin is warm, dry and intact. No rash noted. Psychiatric: Mood and affect are normal. Speech and behavior are normal.  ____________________________________________   LABS (all labs ordered are listed, but only abnormal results are displayed)  Labs Reviewed - No data to  display   PROCEDURES  Procedure(s) performed: None  Procedures  Critical Care performed: No  ____________________________________________   INITIAL IMPRESSION / ASSESSMENT AND PLAN / ED COURSE  As part of my medical decision making, I reviewed the following data within the electronic MEDICAL RECORD NUMBER Notes from prior ED visits and [place] Controlled Substance Database Patient was given Toradol 30 mg IM while in the department.  Patient was given a prescription for prednisone 60 mg 6-day taper.  He is to follow-up with orthopedist of his choice or Dr. Martha Clan who is on call today.  Patient is encouraged to use ice to his back and shoulder.  ____________________________________________   FINAL CLINICAL IMPRESSION(S) / ED DIAGNOSES  Final diagnoses:  Cervical radiculopathy     ED Discharge Orders        Ordered    predniSONE (DELTASONE) 10 MG tablet     07/06/17 1723       Note:  This document was prepared using Dragon voice recognition software and may include unintentional dictation errors.    Tommi Rumps, PA-C 07/06/17 1745    Willy Eddy, MD 07/06/17 [phone]

## 2017-07-06 NOTE — ED Triage Notes (Signed)
Patient c/o L shoulder pain that radiates to L shoulder blade X2 days. Patient reports he has been seen before for this and Trey SailorsXrayed awhile ago with unremarkable results. Denies injury/trauma

## 2017-07-06 NOTE — ED Provider Notes (Signed)
ED ECG REPORT I, Willy EddyPatrick Pattricia Weiher, the attending physician, personally viewed and interpreted this ECG.   Date: 07/06/2017  EKG Time: 16:24  Rate: 50  Rhythm: normal EKG, normal sinus rhythm, unchanged from previous tracings  Axis: normal  Intervals:normal  ST&T Change: no stemi, non specific st changes    Willy Eddyobinson, Corita Allinson, MD 07/06/17 [phone]

## 2017-07-06 NOTE — ED Notes (Signed)
See triage note  Presents with left shoulder pain w/o recent injury  No deformity noted

## 2017-07-11 ENCOUNTER — Emergency Department
Admission: EM | Admit: 2017-07-11 | Discharge: 2017-07-11 | Disposition: A | Discharge status: Home / Self Care | Payer: BLUE CROSS/BLUE SHIELD | Attending: Emergency Medicine | Admitting: Emergency Medicine

## 2017-07-11 ENCOUNTER — Encounter: Payer: Self-pay | Admitting: Emergency Medicine

## 2017-07-11 DIAGNOSIS — G8929 Other chronic pain: Secondary | ICD-10-CM | POA: Diagnosis not present

## 2017-07-11 DIAGNOSIS — M25512 Pain in left shoulder: Secondary | ICD-10-CM | POA: Diagnosis present

## 2017-07-11 DIAGNOSIS — Z79899 Other long term (current) drug therapy: Secondary | ICD-10-CM | POA: Insufficient documentation

## 2017-07-11 DIAGNOSIS — Z87891 Personal history of nicotine dependence: Secondary | ICD-10-CM | POA: Diagnosis not present

## 2017-07-11 DIAGNOSIS — M5412 Radiculopathy, cervical region: Secondary | ICD-10-CM | POA: Diagnosis not present

## 2017-07-11 MED ORDER — KETOROLAC TROMETHAMINE 30 MG/ML IJ SOLN
30.0000 mg | Freq: Once | INTRAMUSCULAR | Status: AC
  Administered 2017-07-11: 30 mg via INTRAMUSCULAR
  Filled 2017-07-11: qty 1

## 2017-07-11 MED ORDER — MELOXICAM 15 MG PO TABS: 15.0000 mg | ORAL_TABLET | Freq: Every day | ORAL | 0 refills | Status: DC

## 2017-07-11 MED ORDER — TRAMADOL HCL 50 MG PO TABS: 50.0000 mg | ORAL_TABLET | Freq: Four times a day (QID) | ORAL | 0 refills | Status: DC | PRN

## 2017-07-11 MED ORDER — METHYLPREDNISOLONE SODIUM SUCC 125 MG IJ SOLR
125.0000 mg | Freq: Once | INTRAMUSCULAR | Status: AC
  Administered 2017-07-11: 125 mg via INTRAMUSCULAR
  Filled 2017-07-11: qty 2

## 2017-07-11 NOTE — ED Triage Notes (Signed)
Pt comes into the ED via PV c/o left shoulder pain.  Patient has been seen here for the same issue and was given prednisone and a Toradol injection.  Patient has had no relief.  Patient ambulatory to triage at this time and in NAD with even and unlabored respirations.  Patient states that it is a throbbing pain. Patient states that there is pain associated with palpation of the chest wall and scapula area.

## 2017-07-11 NOTE — ED Provider Notes (Signed)
[facility] Emergency Department Provider Note ____________________________________________  Time seen: Approximately 10:03 PM  I have reviewed the triage vital signs and the nursing notes.   HISTORY  Chief Complaint Shoulder Pain    HPI JANSEL VONSTEIN is a 47 y.o. male who presents to the emergency department for evaluation and treatment of left shoulder pain.  He denies injury.  He has been evaluated here in the past for similar symptoms.  He was placed on prednisone and given Toradol without relief. Pain is persistent and is "throbbing." Pain radiates into left hand. History reviewed. No pertinent past medical history.  There are no active problems to display for this patient.   Past Surgical History:  Procedure Laterality Date  . FRACTURE SURGERY Left    LLE    Prior to Admission medications   Medication Sig Start Date End Date Taking? Authorizing Provider  meloxicam (MOBIC) 15 MG tablet Take 1 tablet (15 mg total) by mouth daily. 07/11/17   Annalei Friesz, Rulon Eisenmenger B, FNP  predniSONE (DELTASONE) 10 MG tablet Take 6 tablets  today, on day 2 take 5 tablets, day 3 take 4 tablets, day 4 take 3 tablets, day 5 take  2 tablets and 1 tablet the last day 07/06/17   Tommi Rumps, PA-C  traMADol (ULTRAM) 50 MG tablet Take 1 tablet (50 mg total) by mouth every 6 (six) hours as needed. 07/11/17   Chinita Pester, FNP    Allergies Patient has no known allergies.  No family history on file.  Social History Social History   Tobacco Use  . Smoking status: Former Smoker    Last attempt to quit: 2016    Years since quitting: 3.1  . Smokeless tobacco: Never Used  Substance Use Topics  . Alcohol use: Yes    Alcohol/week: 12.6 oz    Types: 21 Cans of beer per week  . Drug use: No    Review of Systems Constitutional: Negative for fever. Cardiovascular: Negative for chest pain. Respiratory: Negative for shortness of breath. Musculoskeletal: Positive for LUE  pain Skin: Negative for rash, lesion, or wound.  Neurological: Negative for decrease in sensation  ____________________________________________   PHYSICAL EXAM:  VITAL SIGNS: ED Triage Vitals  Enc Vitals Group     BP 07/11/17 1707 136/78     Pulse Rate 07/11/17 1707 91     Resp 07/11/17 1707 18     Temp 07/11/17 1707 (!) 97.5 F (36.4 C)     Temp Source 07/11/17 1707 Oral     SpO2 07/11/17 1707 96 %     Weight 07/11/17 1709 160 lb (72.6 kg)     Height 07/11/17 1709 5\' 5"  (1.651 m)     Head Circumference --      Peak Flow --      Pain Score 07/11/17 1709 10     Pain Loc --      Pain Edu? --      Excl. in GC? --     Constitutional: Alert and oriented. Well appearing and in no acute distress. Eyes: Conjunctivae are clear without discharge or drainage Head: Atraumatic Neck: Supple. No midline tenderness. Respiratory: No cough. Respirations are even and unlabored. Musculoskeletal: Negative drop arm test of the left shoulder. Pain increases with abduction past midline. No palpable deformity of effusion. Neurologic: Awake, alert, and oriented x 4.  Skin: Intact without rash, lesion, or wound.  Psychiatric: Affect and behavior are appropriate.  ____________________________________________   LABS (all labs ordered  are listed, but only abnormal results are displayed)  Labs Reviewed - No data to display ____________________________________________  RADIOLOGY  Not indicated. ____________________________________________   PROCEDURES  Procedures  ____________________________________________   INITIAL IMPRESSION / ASSESSMENT AND PLAN / ED COURSE  Rozanna BoerCharlie J Gabay is a 47 y.o. who presents to the emergency department for treatment and evaluation of left shoulder pain. He had requested a "steroid shot" which was given as well as toradol. Patient was strongly encouraged to follow up with orthopedics as soon as possible. He is to return to the ER for symptoms that change or  worsen if unable to schedule an appointment.  Medications  methylPREDNISolone sodium succinate (SOLU-MEDROL) 125 mg/2 mL injection 125 mg (125 mg Intramuscular Given 07/11/17 1834)  ketorolac (TORADOL) 30 MG/ML injection 30 mg (30 mg Intramuscular Given 07/11/17 1835)    Pertinent labs & imaging results that were available during my care of the patient were reviewed by me and considered in my medical decision making (see chart for details).  _________________________________________   FINAL CLINICAL IMPRESSION(S) / ED DIAGNOSES  Final diagnoses:  Chronic left shoulder pain  Cervical radiculopathy    ED Discharge Orders        Ordered    meloxicam (MOBIC) 15 MG tablet  Daily     07/11/17 1826    traMADol (ULTRAM) 50 MG tablet  Every 6 hours PRN     07/11/17 1826       If controlled substance prescribed during this visit, 12 month history viewed on the [place] prior to issuing an initial prescription for Schedule II or III opiod.    Chinita Pesterriplett, Lindsay Soulliere B, FNP 07/11/17 2230    Sharman CheekStafford, Phillip, MD 07/11/17 [phone]

## 2018-03-05 ENCOUNTER — Emergency Department: Payer: BLUE CROSS/BLUE SHIELD

## 2018-03-05 ENCOUNTER — Emergency Department
Admission: EM | Admit: 2018-03-05 | Discharge: 2018-03-05 | Disposition: A | Discharge status: Home / Self Care | Payer: BLUE CROSS/BLUE SHIELD | Attending: Emergency Medicine | Admitting: Emergency Medicine

## 2018-03-05 ENCOUNTER — Other Ambulatory Visit: Payer: Self-pay

## 2018-03-05 ENCOUNTER — Encounter: Payer: Self-pay | Admitting: Emergency Medicine

## 2018-03-05 DIAGNOSIS — Z87891 Personal history of nicotine dependence: Secondary | ICD-10-CM | POA: Diagnosis not present

## 2018-03-05 DIAGNOSIS — K029 Dental caries, unspecified: Secondary | ICD-10-CM | POA: Insufficient documentation

## 2018-03-05 DIAGNOSIS — K0889 Other specified disorders of teeth and supporting structures: Secondary | ICD-10-CM | POA: Diagnosis present

## 2018-03-05 DIAGNOSIS — R531 Weakness: Secondary | ICD-10-CM

## 2018-03-05 LAB — CBC WITH DIFFERENTIAL/PLATELET
Abs Immature Granulocytes: 0.01 10*3/uL (ref 0.00–0.07)
BASOS ABS: 0 10*3/uL (ref 0.0–0.1)
Basophils Relative: 0 %
EOS ABS: 0.1 10*3/uL (ref 0.0–0.5)
EOS PCT: 2 %
HEMATOCRIT: 39.8 % (ref 39.0–52.0)
Hemoglobin: 13 g/dL (ref 13.0–17.0)
Immature Granulocytes: 0 %
LYMPHS ABS: 0.7 10*3/uL (ref 0.7–4.0)
Lymphocytes Relative: 14 %
MCH: 29.8 pg (ref 26.0–34.0)
MCHC: 32.7 g/dL (ref 30.0–36.0)
MCV: 91.3 fL (ref 80.0–100.0)
MONOS PCT: 10 %
Monocytes Absolute: 0.5 10*3/uL (ref 0.1–1.0)
NRBC: 0 % (ref 0.0–0.2)
Neutro Abs: 3.5 10*3/uL (ref 1.7–7.7)
Neutrophils Relative %: 74 %
Platelets: 183 10*3/uL (ref 150–400)
RBC: 4.36 MIL/uL (ref 4.22–5.81)
RDW: 13.2 % (ref 11.5–15.5)
WBC: 4.8 10*3/uL (ref 4.0–10.5)

## 2018-03-05 LAB — COMPREHENSIVE METABOLIC PANEL
ALBUMIN: 3.7 g/dL (ref 3.5–5.0)
ALT: 24 U/L (ref 0–44)
ANION GAP: 6 (ref 5–15)
AST: 21 U/L (ref 15–41)
Alkaline Phosphatase: 44 U/L (ref 38–126)
BILIRUBIN TOTAL: 0.5 mg/dL (ref 0.3–1.2)
BUN: 9 mg/dL (ref 6–20)
CO2: 26 mmol/L (ref 22–32)
Calcium: 8.9 mg/dL (ref 8.9–10.3)
Chloride: 105 mmol/L (ref 98–111)
Creatinine, Ser: 1.03 mg/dL (ref 0.61–1.24)
GFR calc Af Amer: >60 mL/min (ref >60)
GFR calc non Af Amer: >60 mL/min (ref >60)
GLUCOSE: 114 mg/dL — AB (ref 70–99)
POTASSIUM: 3.4 mmol/L — AB (ref 3.5–5.1)
Sodium: 137 mmol/L (ref 135–145)
TOTAL PROTEIN: 6.5 g/dL (ref 6.5–8.1)

## 2018-03-05 LAB — TROPONIN I: Troponin I: <0.03 ng/mL (ref <0.03)

## 2018-03-05 LAB — FIBRIN DERIVATIVES D-DIMER (ARMC ONLY): Fibrin derivatives D-dimer (ARMC): 221.71 ng/mL (FEU) (ref 0.00–499.00)

## 2018-03-05 MED ORDER — ONDANSETRON HCL 4 MG/2ML IJ SOLN
4.0000 mg | Freq: Once | INTRAMUSCULAR | Status: AC
  Administered 2018-03-05: 4 mg via INTRAVENOUS
  Filled 2018-03-05: qty 2

## 2018-03-05 MED ORDER — SODIUM CHLORIDE 0.9 % IV BOLUS
1000.0000 mL | Freq: Once | INTRAVENOUS | Status: AC
  Administered 2018-03-05: 1000 mL via INTRAVENOUS

## 2018-03-05 MED ORDER — MORPHINE SULFATE (PF) 4 MG/ML IV SOLN
4.0000 mg | Freq: Once | INTRAVENOUS | Status: AC
  Administered 2018-03-05: 4 mg via INTRAVENOUS
  Filled 2018-03-05: qty 1

## 2018-03-05 MED ORDER — MELOXICAM 15 MG PO TABS: 15.0000 mg | ORAL_TABLET | Freq: Every day | ORAL | 2 refills | Status: DC

## 2018-03-05 NOTE — ED Notes (Signed)
Lab labels would not print. Lab and IT called.

## 2018-03-05 NOTE — ED Provider Notes (Signed)
[facility] Emergency Department Provider Note  ____________________________________________   First MD Initiated Contact with Patient 03/05/18 1900     (approximate)  I have reviewed the triage vital signs and the nursing notes.   HISTORY  Chief Complaint Dental Pain and Weakness    HPI Maurice Perez is a 47 y.o. male resents to the emergency department complaining of weakness after having a tooth removed 2 weeks ago.  His wife states he has not been eating well.  She states he is on the antibiotic but started having a fever and chills last night.  They are concerned because he had some blood noted on his pillow.  He denies chest pain or shortness of breath at this time.  He states he has a dry hacking cough.  He denies any vomiting or diarrhea.    History reviewed. No pertinent past medical history.  There are no active problems to display for this patient.   Past Surgical History:  Procedure Laterality Date  . FRACTURE SURGERY Left    LLE    Prior to Admission medications   Medication Sig Start Date End Date Taking? Authorizing Provider  meloxicam (MOBIC) 15 MG tablet Take 1 tablet (15 mg total) by mouth daily. 03/05/18 03/05/19  Versie Starks, PA-C    Allergies Singulair [montelukast sodium]  No family history on file.  Social History Social History   Tobacco Use  . Smoking status: Former Smoker    Last attempt to quit: 2016    Years since quitting: 3.8  . Smokeless tobacco: Never Used  Substance Use Topics  . Alcohol use: Yes    Alcohol/week: 21.0 standard drinks    Types: 21 Cans of beer per week  . Drug use: No    Review of Systems  Constitutional: Positive fever/chills, positive weakness Eyes: No visual changes. ENT: No sore throat.  Positive for bleeding from a recently removed tooth Respiratory: Positive cough Cardiovascular: Denies chest pain/shortness of breath Genitourinary: Negative for  dysuria. Musculoskeletal: Negative for back pain. Skin: Negative for rash.    ____________________________________________   PHYSICAL EXAM:  VITAL SIGNS: ED Triage Vitals [03/05/18 1830]  Enc Vitals Group     BP 122/76     Pulse Rate 87     Resp 20     Temp 97.9 F (36.6 C)     Temp Source Oral     SpO2 98 %     Weight 160 lb (72.6 kg)     Height 5' 5"  (1.651 m)     Head Circumference      Peak Flow      Pain Score 10     Pain Loc      Pain Edu?      Excl. in [place]?     Constitutional: Alert and oriented. Well appearing and in no acute distress.  However the patient does appear to be a little weak. Eyes: Conjunctivae are normal.  Head: Atraumatic. Nose: No congestion/rhinnorhea. Mouth/Throat: Mucous membranes are moist.  Evidence of a recently removed tooth is noted in the left lower molars.  There is no active bleeding or foreign body noted. Neck:  supple no lymphadenopathy noted Cardiovascular: Normal rate, regular rhythm. Heart sounds are normal Respiratory: Normal respiratory effort.  No retractions, lungs c t a  Abd: soft nontender bs normal all 4 quad GU: deferred Musculoskeletal: FROM all extremities, warm and well perfused Neurologic:  Normal speech and language.  Skin:  Skin is warm, dry  and intact. No rash noted. Psychiatric: Mood and affect are normal. Speech and behavior are normal.  ____________________________________________   LABS (all labs ordered are listed, but only abnormal results are displayed)  Labs Reviewed  COMPREHENSIVE METABOLIC PANEL - Abnormal; Notable for the following components:      Result Value   Potassium 3.4 (*)    Glucose, Bld 114 (*)    All other components within normal limits  FIBRIN DERIVATIVES D-DIMER ([facility] ONLY)  CBC WITH DIFFERENTIAL/PLATELET  TROPONIN I   ____________________________________________   ____________________________________________  RADIOLOGY  Chest x-ray is  negative  ____________________________________________   PROCEDURES  Procedure(s) performed: Saline lock, 1 L normal saline, morphine 4 mg IV, Zofran 4 mg IV  Procedures    ____________________________________________   INITIAL IMPRESSION / ASSESSMENT AND PLAN / ED COURSE  Pertinent labs & imaging results that were available during my care of the patient were reviewed by me and considered in my medical decision making (see chart for details).   Patient is a 47 year old male presents emergency department complaining of dental pain, weakness, fever and chills.  He had a recent dental procedure.  Physical exam patient does appear to be a little weak.  He is afebrile here in the emergency department.  There is evidence of a recently pulled tooth.  There is no active bleeding or foreign body noted.  Lungs are clear to auscultation.  Heart sounds are normal.  Abdomen is unremarkable.  Saline lock, normal saline 1 L IV, morphine 4 mg IV, Zofran 4 mg IV.  CBC is normal, met C is basically normal, troponin is negative at 0.03.,  Fibrin derivative is still pending.  EKG shows bradycardia.   The patient states that his heart rate is always slow.  He states that he feels better after the pain medication and fluids.  He was given a prescription for meloxicam. .  He is to follow-up with his regular dentist.  He was discharged in stable condition  As part of my medical decision making, I reviewed the following data within the [place] History obtained from family, Nursing notes reviewed and incorporated, Labs reviewed d-dimer is negative, CBC is normal, troponin is negative, comprehensive metabolic panel is normal., EKG interpreted bradycardia, Old chart reviewed, Radiograph reviewed chest x-rays negative, Notes from prior ED visits and [place] Controlled Substance Database  ____________________________________________   FINAL CLINICAL IMPRESSION(S) / ED DIAGNOSES  Final  diagnoses:  Pain due to dental caries  Generalized weakness      NEW MEDICATIONS STARTED DURING THIS VISIT:  Discharge Medication List as of 03/05/2018  8:34 PM       Note:  This document was prepared using Dragon voice recognition software and may include unintentional dictation errors.    Versie Starks, PA-C 03/05/18 [place], Frank, MD 03/06/18 [phone]

## 2018-03-05 NOTE — ED Triage Notes (Signed)
Pt reports had tooth extracted 2 days ago reports has been having pain pain reports continues to spit blood, not been able to eat reports not feeling well feeling tired and fatigued. Pt talks in complete sentences, reports taking antibiotics and ibuprofen.

## 2018-03-05 NOTE — ED Notes (Signed)
Discharged  Put on hold [facility]  Notified of vital signs  specifically the heart rate

## 2018-03-05 NOTE — ED Notes (Signed)
Pt discovered in room at this time.

## 2018-03-05 NOTE — Discharge Instructions (Addendum)
Follow-up with your regular doctor if not better in 2 to 3 days.  Return to the emergency department if you are becoming worse.  Take Tylenol and ibuprofen for the pain as needed.

## 2019-01-02 ENCOUNTER — Emergency Department
Admission: EM | Admit: 2019-01-02 | Discharge: 2019-01-02 | Disposition: A | Discharge status: Home / Self Care | Payer: BC Managed Care – PPO | Attending: Emergency Medicine | Admitting: Emergency Medicine

## 2019-01-02 ENCOUNTER — Encounter: Payer: Self-pay | Admitting: Emergency Medicine

## 2019-01-02 ENCOUNTER — Emergency Department: Payer: BC Managed Care – PPO

## 2019-01-02 ENCOUNTER — Other Ambulatory Visit: Payer: Self-pay

## 2019-01-02 DIAGNOSIS — Z87891 Personal history of nicotine dependence: Secondary | ICD-10-CM | POA: Diagnosis not present

## 2019-01-02 DIAGNOSIS — M25561 Pain in right knee: Secondary | ICD-10-CM | POA: Diagnosis present

## 2019-01-02 MED ORDER — MELOXICAM 15 MG PO TABS: 15.0000 mg | ORAL_TABLET | Freq: Every day | ORAL | 2 refills | Status: DC

## 2019-01-02 NOTE — ED Triage Notes (Signed)
Pt to triage via w/c with no distress noted, mask in place; pt reports rt knee pain x 2 days with no known injury

## 2019-01-02 NOTE — ED Provider Notes (Signed)
[facility] Emergency Department Provider Note  ____________________________________________  Time seen: Approximately 10:19 PM  I have reviewed the triage vital signs and the nursing notes.   HISTORY  Chief Complaint Knee Pain    HPI Maurice Perez is a 48 y.o. male presents to the emergency department with acute right knee pain for the past 2 days.  Patient localizes pain to anterior aspect of the right knee.  Patient denies falls or mechanisms of trauma.  He denies daily smoking, prolonged immobility, recent surgery or history of DVT or PE.  Patient states that pain is worse with prolonged standing and ambulation and improved with rest.  He has not noticed any swelling or erythema of right lower extremity.  No other alleviating measures been attempted.        History reviewed. No pertinent past medical history.  There are no active problems to display for this patient.   Past Surgical History:  Procedure Laterality Date  . FRACTURE SURGERY Left    LLE    Prior to Admission medications   Medication Sig Start Date End Date Taking? Authorizing Provider  meloxicam (MOBIC) 15 MG tablet Take 1 tablet (15 mg total) by mouth daily. 01/02/19 01/02/20  Orvil FeilWoods, Kiven Vangilder M, PA-C    Allergies Singulair [montelukast sodium]  No family history on file.  Social History Social History   Tobacco Use  . Smoking status: Former Smoker    Quit date: 2016    Years since quitting: 4.6  . Smokeless tobacco: Never Used  Substance Use Topics  . Alcohol use: Yes    Alcohol/week: 21.0 standard drinks    Types: 21 Cans of beer per week  . Drug use: No     Review of Systems  Constitutional: No fever/chills Eyes: No visual changes. No discharge ENT: No upper respiratory complaints. Cardiovascular: no chest pain. Respiratory: no cough. No SOB. Gastrointestinal: No abdominal pain.  No nausea, no vomiting.  No diarrhea.  No constipation. Musculoskeletal: Patient  has right knee pain.  Skin: Negative for rash, abrasions, lacerations, ecchymosis. Neurological: Negative for headaches, focal weakness or numbness.   ____________________________________________   PHYSICAL EXAM:  VITAL SIGNS: ED Triage Vitals  Enc Vitals Group     BP 01/02/19 2049 (!) 141/75     Pulse Rate 01/02/19 2049 (!) 50     Resp 01/02/19 2049 18     Temp 01/02/19 2049 98.8 F (37.1 C)     Temp Source 01/02/19 2049 Oral     SpO2 01/02/19 2049 97 %     Weight 01/02/19 2048 170 lb (77.1 kg)     Height 01/02/19 2048 5\' 4"  (1.626 m)     Head Circumference --      Peak Flow --      Pain Score 01/02/19 2048 10     Pain Loc --      Pain Edu? --      Excl. in [place]? --      Constitutional: Alert and oriented. Well appearing and in no acute distress. Eyes: Conjunctivae are normal. PERRL. EOMI. Head: Atraumatic. Cardiovascular: Normal rate, regular rhythm. Normal S1 and S2.  Good peripheral circulation. Respiratory: Normal respiratory effort without tachypnea or retractions. Lungs CTAB. Good air entry to the bases with no decreased or absent breath sounds. Musculoskeletal: Patient demonstrates full range of motion at the right knee.  Crepitus appreciated with range of motion testing.  No deficits were noted with provocative testing.  Palpable dorsalis pedis pulse, right.  Neurologic:  Normal speech and language. No gross focal neurologic deficits are appreciated.  Skin:  Skin is warm, dry and intact. No rash noted. Psychiatric: Mood and affect are normal. Speech and behavior are normal. Patient exhibits appropriate insight and judgement.   ____________________________________________   LABS (all labs ordered are listed, but only abnormal results are displayed)  Labs Reviewed - No data to display ____________________________________________  EKG   ____________________________________________  RADIOLOGY I personally viewed and evaluated these images as part of my  medical decision making, as well as reviewing the written report by the radiologist.  Dg Knee Complete 4 Views Right  Result Date: 01/02/2019 CLINICAL DATA:  Right knee pain EXAM: RIGHT KNEE - COMPLETE 4+ VIEW COMPARISON:  None. FINDINGS: Spurring in the patellofemoral compartment. No acute bony abnormality. Specifically, no fracture, subluxation, or dislocation. Joint spaces maintained. No joint effusion. IMPRESSION: Patellofemoral spurring.  No acute bony abnormality. Electronically Signed   By: Rolm Baptise M.D.   On: 01/02/2019 21:51    ____________________________________________    PROCEDURES  Procedure(s) performed:    Procedures    Medications - No data to display   ____________________________________________   INITIAL IMPRESSION / ASSESSMENT AND PLAN / ED COURSE  Pertinent labs & imaging results that were available during my care of the patient were reviewed by me and considered in my medical decision making (see chart for details).  Review of the [place] CSRS was performed in accordance of the [place] prior to dispensing any controlled drugs.        Assessment and plan Right knee pain 48 year old male presents to the emergency department with acute right knee pain for the past 2 days.  Patient was mildly hypertensive and bradycardic in the emergency department.  Provocative testing of the right knee revealed no acute deficits.  X-ray examination of the right knee revealed no bony abnormalities.  Patient was started on meloxicam and a work note was given at his request.  He was advised to follow-up with orthopedics as needed.  All patient questions were answered.    ____________________________________________  FINAL CLINICAL IMPRESSION(S) / ED DIAGNOSES  Final diagnoses:  Acute pain of right knee      NEW MEDICATIONS STARTED DURING THIS VISIT:  ED Discharge Orders         Ordered    meloxicam (MOBIC) 15 MG tablet  Daily     01/02/19 2215               This chart was dictated using voice recognition software/Dragon. Despite best efforts to proofread, errors can occur which can change the meaning. Any change was purely unintentional.    Karren Cobble 01/02/19 2221    Duffy Bruce, MD 01/03/19 1029

## 2019-01-30 ENCOUNTER — Emergency Department
Admission: EM | Admit: 2019-01-30 | Discharge: 2019-01-30 | Disposition: A | Discharge status: Home / Self Care | Payer: BC Managed Care – PPO | Attending: Student in an Organized Health Care Education/Training Program | Admitting: Student in an Organized Health Care Education/Training Program

## 2019-01-30 ENCOUNTER — Emergency Department: Payer: BC Managed Care – PPO

## 2019-01-30 ENCOUNTER — Other Ambulatory Visit: Payer: Self-pay

## 2019-01-30 ENCOUNTER — Encounter: Payer: Self-pay | Admitting: Emergency Medicine

## 2019-01-30 DIAGNOSIS — Z87891 Personal history of nicotine dependence: Secondary | ICD-10-CM | POA: Diagnosis not present

## 2019-01-30 DIAGNOSIS — M5431 Sciatica, right side: Secondary | ICD-10-CM

## 2019-01-30 DIAGNOSIS — M79604 Pain in right leg: Secondary | ICD-10-CM | POA: Diagnosis present

## 2019-01-30 MED ORDER — PREDNISONE 10 MG PO TABS: ORAL_TABLET | ORAL | 0 refills | Status: DC

## 2019-01-30 MED ORDER — LIDOCAINE 5 % EX PTCH: 1.0000 | MEDICATED_PATCH | CUTANEOUS | 0 refills | Status: DC

## 2019-01-30 MED ORDER — ORPHENADRINE CITRATE 30 MG/ML IJ SOLN
60.0000 mg | Freq: Two times a day (BID) | INTRAMUSCULAR | Status: DC
  Administered 2019-01-30: 15:00:00 60 mg via INTRAMUSCULAR
  Filled 2019-01-30: qty 2

## 2019-01-30 MED ORDER — KETOROLAC TROMETHAMINE 30 MG/ML IJ SOLN
30.0000 mg | Freq: Once | INTRAMUSCULAR | Status: AC
  Administered 2019-01-30: 30 mg via INTRAMUSCULAR
  Filled 2019-01-30: qty 1

## 2019-01-30 MED ORDER — LIDOCAINE 5 % EX PTCH
1.0000 | MEDICATED_PATCH | CUTANEOUS | Status: DC
  Administered 2019-01-30: 1 via TRANSDERMAL
  Filled 2019-01-30: qty 1

## 2019-01-30 MED ORDER — KETOROLAC TROMETHAMINE 10 MG PO TABS: 10.0000 mg | ORAL_TABLET | Freq: Four times a day (QID) | ORAL | 0 refills | Status: DC | PRN

## 2019-01-30 MED ORDER — PREDNISONE 20 MG PO TABS
60.0000 mg | ORAL_TABLET | Freq: Once | ORAL | Status: AC
  Administered 2019-01-30: 60 mg via ORAL
  Filled 2019-01-30: qty 3

## 2019-01-30 MED ORDER — CYCLOBENZAPRINE HCL 5 MG PO TABS: ORAL_TABLET | ORAL | 0 refills | Status: DC

## 2019-01-30 NOTE — ED Triage Notes (Signed)
Presents with right hip and leg pain     States has been seen for same in same  But pain is getting worse

## 2019-01-30 NOTE — ED Provider Notes (Signed)
[facility] Emergency Department Provider Note  ____________________________________________  Time seen: Approximately 1:47 PM  I have reviewed the triage vital signs and the nursing notes.   HISTORY  Chief Complaint Hip Pain    HPI Maurice Perez is a 48 y.o. male that presents to the emergency department for evaluation of right leg pain for 2 weeks.  Patient states that pain starts in his right buttocks and goes throughout his entire right leg.  He sees Dr. Hyacinth MeekerMiller with ortho for this and is going to have an MRI scheduled.  They gave him a prescription for a muscle relaxer and an anti-inflammatory.  He recalls being told that this is sciatica.  No injury.  No bowel or bladder dysfunction or saddle anesthesias.  No leg swelling.  No numbness, tingling.  History reviewed. No pertinent past medical history.  There are no active problems to display for this patient.   Past Surgical History:  Procedure Laterality Date  . FRACTURE SURGERY Left    LLE    Prior to Admission medications   Medication Sig Start Date End Date Taking? Authorizing Provider  cyclobenzaprine (FLEXERIL) 5 MG tablet Take 1-2 tablets 3 times daily as needed 01/30/19   [place] DerryWagner, Taeja Debellis, PA-C  ketorolac (TORADOL) 10 MG tablet Take 1 tablet (10 mg total) by mouth every 6 (six) hours as needed. 01/30/19   [place] DerryWagner, Markham Dumlao, PA-C  lidocaine (LIDODERM) 5 % Place 1 patch onto the skin daily. Remove & Discard patch within 12 hours or as directed by MD 01/30/19   [place] DerryWagner, Parthiv Mucci, PA-C  meloxicam (MOBIC) 15 MG tablet Take 1 tablet (15 mg total) by mouth daily. 01/02/19 01/02/20  Orvil FeilWoods, Jaclyn M, PA-C  predniSONE (DELTASONE) 10 MG tablet Take 6 tablets on day 1, take 5 tablets on day 2, take 4 tablets on day 3, take 3 tablets on day 4, take 2 tablets on day 5, take 1 tablet on day 6 01/30/19   [place] DerryWagner, Alannie Amodio, PA-C    Allergies Singulair [montelukast sodium]  No family history on file.  Social  History Social History   Tobacco Use  . Smoking status: Former Smoker    Quit date: 2016    Years since quitting: 4.7  . Smokeless tobacco: Never Used  Substance Use Topics  . Alcohol use: Yes    Alcohol/week: 21.0 standard drinks    Types: 21 Cans of beer per week  . Drug use: No     Review of Systems  Constitutional: No fever/chills Cardiovascular: No chest pain. Respiratory:  No SOB. Gastrointestinal: No abdominal pain.  No nausea, no vomiting.  Genitourinary: Negative for dysuria. Musculoskeletal: Positive for back and leg pain. Skin: Negative for rash, abrasions, lacerations, ecchymosis. Neurological: Negative for headaches, numbness or tingling   ____________________________________________   PHYSICAL EXAM:  VITAL SIGNS: ED Triage Vitals  Enc Vitals Group     BP 01/30/19 1232 140/70     Pulse Rate 01/30/19 1232 (!) 58     Resp 01/30/19 1232 16     Temp 01/30/19 1232 98 F (36.7 C)     Temp Source 01/30/19 1232 Oral     SpO2 01/30/19 1232 98 %     Weight --      Height --      Head Circumference --      Peak Flow --      Pain Score 01/30/19 1228 10     Pain Loc --      Pain Edu? --  Excl. in [place]? --      Constitutional: Alert and oriented. Well appearing and in no acute distress. Eyes: Conjunctivae are normal. PERRL. EOMI. Head: Atraumatic. ENT:      Ears:      Nose: No congestion/rhinnorhea.      Mouth/Throat: Mucous membranes are moist.  Neck: No stridor. Cardiovascular: Normal rate, regular rhythm.  Good peripheral circulation. Respiratory: Normal respiratory effort without tachypnea or retractions. Lungs CTAB. Good air entry to the bases with no decreased or absent breath sounds. Musculoskeletal: Full range of motion to all extremities. No gross deformities appreciated.  Tenderness to palpation to right mid buttocks.  Strength equal in lower extremities bilaterally.  Normal gait. Neurologic:  Normal speech and language. No gross focal  neurologic deficits are appreciated.  Skin:  Skin is warm, dry and intact. No rash noted. Psychiatric: Mood and affect are normal. Speech and behavior are normal. Patient exhibits appropriate insight and judgement.   ____________________________________________   LABS (all labs ordered are listed, but only abnormal results are displayed)  Labs Reviewed - No data to display ____________________________________________  EKG   ____________________________________________  RADIOLOGY Lexine Baton, personally viewed and evaluated these images (plain radiographs) as part of my medical decision making, as well as reviewing the written report by the radiologist.  Dg Lumbar Spine 2-3 Views  Result Date: 01/30/2019 CLINICAL DATA:  Right hip and leg pain, no injury EXAM: LUMBAR SPINE - 2-3 VIEW COMPARISON:  05/21/2015 FINDINGS: Mild dextroscoliosis.  Negative for fracture or pars defect Progression of disc degeneration at L1-2 and L3-4 with progressive disc space narrowing and mild spurring. Mild disc space narrowing L2-3 unchanged. L4-5 and L5-S1 intact. 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 Electronically Signed   By: Marlan Palau M.D.   On: 01/30/2019 14:23   US Venous Img Lower Unilateral Right  Result Date: 01/30/2019 CLINICAL DATA:  Right lower extremity pain for the past 2 weeks. Evaluate for DVT. EXAM: RIGHT LOWER EXTREMITY VENOUS DOPPLER ULTRASOUND TECHNIQUE: Gray-scale sonography with graded compression, as well as color Doppler and duplex ultrasound were performed to evaluate the lower extremity deep venous systems from the level of the common femoral vein and including the common femoral, femoral, profunda femoral, popliteal and calf veins including the posterior tibial, peroneal and gastrocnemius veins when visible. The superficial great saphenous vein was also interrogated. Spectral Doppler was utilized to  evaluate flow at rest and with distal augmentation maneuvers in the common femoral, femoral and popliteal veins. COMPARISON:  None. FINDINGS: Contralateral Common Femoral Vein: Respiratory phasicity is normal and symmetric with the symptomatic side. No evidence of thrombus. Normal compressibility. Common Femoral Vein: No evidence of thrombus. Normal compressibility, respiratory phasicity and response to augmentation. Saphenofemoral Junction: No evidence of thrombus. Normal compressibility and flow on color Doppler imaging. Profunda Femoral Vein: No evidence of thrombus. Normal compressibility and flow on color Doppler imaging. Femoral Vein: No evidence of thrombus. Normal compressibility, respiratory phasicity and response to augmentation. Popliteal Vein: No evidence of thrombus. Normal compressibility, respiratory phasicity and response to augmentation. Calf Veins: No evidence of thrombus. Normal compressibility and flow on color Doppler imaging. Superficial Great Saphenous Vein: No evidence of thrombus. Normal compressibility. Venous Reflux:  None. Other Findings:  None. IMPRESSION: No evidence of DVT within the right lower extremity. Electronically Signed   By: Simonne Come M.D.   On: 01/30/2019 14:06    ____________________________________________    PROCEDURES  Procedure(s)  performed:    Procedures    Medications  lidocaine (LIDODERM) 5 % 1 patch (1 patch Transdermal Patch Applied 01/30/19 1527)  orphenadrine (NORFLEX) injection 60 mg (60 mg Intramuscular Given 01/30/19 1527)  ketorolac (TORADOL) 30 MG/ML injection 30 mg (30 mg Intramuscular Given 01/30/19 1527)  predniSONE (DELTASONE) tablet 60 mg (60 mg Oral Given 01/30/19 1527)     ____________________________________________   INITIAL IMPRESSION / ASSESSMENT AND PLAN / ED COURSE  Pertinent labs & imaging results that were available during my care of the patient were reviewed by me and considered in my medical decision making (see  chart for details).  Review of the [place] CSRS was performed in accordance of the [place] prior to dispensing any controlled drugs.   Patient's diagnosis is consistent with sciatica.  Vital signs and exam are reassuring.  X-ray findings were discussed with patient.  No DVT on ultrasound.  Patient will be discharged home with prescriptions for prednisone, Toradol, Flexeril. Patient is to follow up with ortho as directed. Patient is given ED precautions to return to the ED for any worsening or new symptoms.   TYREK LAWHORN was evaluated in Emergency Department on 01/30/2019 for the symptoms described in the history of present illness. He was evaluated in the context of the global COVID-19 pandemic, which necessitated consideration that the patient might be at risk for infection with the SARS-CoV-2 virus that causes COVID-19. Institutional protocols and algorithms that pertain to the evaluation of patients at risk for COVID-19 are in a state of rapid change based on information released by regulatory bodies including the CDC and federal and state organizations. These policies and algorithms were followed during the patient's care in the ED.  ____________________________________________  FINAL CLINICAL IMPRESSION(S) / ED DIAGNOSES  Final diagnoses:  Sciatica of right side      NEW MEDICATIONS STARTED DURING THIS VISIT:  ED Discharge Orders         Ordered    cyclobenzaprine (FLEXERIL) 5 MG tablet     01/30/19 1504    ketorolac (TORADOL) 10 MG tablet  Every 6 hours PRN     01/30/19 1504    predniSONE (DELTASONE) 10 MG tablet     01/30/19 1504    lidocaine (LIDODERM) 5 %  Every 24 hours     01/30/19 1504              This chart was dictated using voice recognition software/Dragon. Despite best efforts to proofread, errors can occur which can change the meaning. Any change was purely unintentional.    Laban Emperor, PA-C 01/30/19 1646    Merlyn Lot, MD 01/31/19 [phone]

## 2019-02-27 ENCOUNTER — Emergency Department
Admission: EM | Admit: 2019-02-27 | Discharge: 2019-02-27 | Disposition: A | Discharge status: Home / Self Care | Payer: BC Managed Care – PPO | Attending: Emergency Medicine | Admitting: Emergency Medicine

## 2019-02-27 ENCOUNTER — Other Ambulatory Visit: Payer: Self-pay

## 2019-02-27 DIAGNOSIS — Z87891 Personal history of nicotine dependence: Secondary | ICD-10-CM | POA: Insufficient documentation

## 2019-02-27 DIAGNOSIS — M79604 Pain in right leg: Secondary | ICD-10-CM | POA: Insufficient documentation

## 2019-02-27 MED ORDER — PREDNISONE 20 MG PO TABS
60.0000 mg | ORAL_TABLET | Freq: Once | ORAL | Status: AC
  Administered 2019-02-27: 07:00:00 60 mg via ORAL
  Filled 2019-02-27: qty 3

## 2019-02-27 MED ORDER — GABAPENTIN 300 MG PO CAPS: 300.0000 mg | ORAL_CAPSULE | Freq: Three times a day (TID) | ORAL | 0 refills | Status: DC | PRN

## 2019-02-27 MED ORDER — GABAPENTIN 300 MG PO CAPS
300.0000 mg | ORAL_CAPSULE | Freq: Once | ORAL | Status: AC
  Administered 2019-02-27: 300 mg via ORAL
  Filled 2019-02-27: qty 1

## 2019-02-27 MED ORDER — PREDNISONE 10 MG (21) PO TBPK: ORAL_TABLET | ORAL | 0 refills | Status: DC

## 2019-02-27 NOTE — ED Triage Notes (Signed)
Patient reports right leg pain for the past 3 weeks.  Denies any type of injury.  Reports has been seen for the same symptoms in the ED before.

## 2019-02-27 NOTE — ED Notes (Signed)
No peripheral IV placed this visit.    Discharge instructions reviewed with patient. Questions fielded by this RN. Patient verbalizes understanding of instructions. Patient discharged home in stable condition per [facility]. No acute distress noted at time of discharge.    

## 2019-02-27 NOTE — ED Notes (Signed)
Pt c/o right leg pain for the last 2 weeks that runs from buttocks to ankle, CMS intact, visible limp, pt denies hx of trauma, no apparent swelling

## 2019-02-27 NOTE — Discharge Instructions (Addendum)
Please seek medical attention for any high fevers, chest pain, shortness of breath, change in behavior, persistent vomiting, bloody stool or any other new or concerning symptoms.  

## 2019-02-27 NOTE — ED Provider Notes (Signed)
[facility] Emergency Department Provider Note  ____________________________________________   I have reviewed the triage vital signs and the nursing notes.   HISTORY  Chief Complaint Right leg pain  History limited by: Not Limited   HPI Maurice Perez is a 48 y.o. male who presents to the emergency department today because of concerns for continued right leg pain and to see if he could obtain an MRI.  He states that this pain has been ongoing for weeks.  He has been trying over-the-counter pain medications without any significant relief.  Says that he did see Dr. Sabra Heck with orthopedics and that they would like to get an MRI however because of insurance issues he has not been able to obtain this.  He says that the pain radiates down to his ankle.  He denies any change in bladder or bowel habits.   Records reviewed. Per medical record review patient has a history of ER visit for right leg pain recently.   No past medical history on file.  There are no active problems to display for this patient.   Past Surgical History:  Procedure Laterality Date  . FRACTURE SURGERY Left    LLE    Prior to Admission medications   Medication Sig Start Date End Date Taking? Authorizing Provider  cyclobenzaprine (FLEXERIL) 5 MG tablet Take 1-2 tablets 3 times daily as needed 01/30/19   Laban Emperor, PA-C  ketorolac (TORADOL) 10 MG tablet Take 1 tablet (10 mg total) by mouth every 6 (six) hours as needed. 01/30/19   Laban Emperor, PA-C  lidocaine (LIDODERM) 5 % Place 1 patch onto the skin daily. Remove & Discard patch within 12 hours or as directed by MD 01/30/19   Laban Emperor, PA-C  meloxicam (MOBIC) 15 MG tablet Take 1 tablet (15 mg total) by mouth daily. 01/02/19 01/02/20  Lannie Fields, PA-C  predniSONE (DELTASONE) 10 MG tablet Take 6 tablets on day 1, take 5 tablets on day 2, take 4 tablets on day 3, take 3 tablets on day 4, take 2 tablets on day 5, take 1 tablet on  day 6 01/30/19   Laban Emperor, PA-C    Allergies Singulair [montelukast sodium]  No family history on file.  Social History Social History   Tobacco Use  . Smoking status: Former Smoker    Quit date: 2016    Years since quitting: 4.8  . Smokeless tobacco: Never Used  Substance Use Topics  . Alcohol use: Yes    Alcohol/week: 21.0 standard drinks    Types: 21 Cans of beer per week  . Drug use: No    Review of Systems Constitutional: No fever/chills Eyes: No visual changes. ENT: No sore throat. Cardiovascular: Denies chest pain. Respiratory: Denies shortness of breath. Gastrointestinal: No abdominal pain.  No nausea, no vomiting.  No diarrhea.   Genitourinary: Negative for dysuria. Musculoskeletal: Positive for right leg pain. Skin: Negative for rash. Neurological: Negative for headaches, focal weakness or numbness.  ____________________________________________   PHYSICAL EXAM:  VITAL SIGNS: ED Triage Vitals  Enc Vitals Group     BP 02/27/19 0118 105/69     Pulse Rate 02/27/19 0118 (!) 57     Resp 02/27/19 0118 17     Temp 02/27/19 0118 98.4 F (36.9 C)     Temp Source 02/27/19 0118 Oral     SpO2 02/27/19 0118 100 %     Weight 02/27/19 0119 170 lb (77.1 kg)     Height 02/27/19 0119  5\' 5"  (1.651 m)     Head Circumference --      Peak Flow --      Pain Score 02/27/19 0118 10    Constitutional: Alert and oriented.  Eyes: Conjunctivae are normal.  ENT      Head: Normocephalic and atraumatic.      Nose: No congestion/rhinnorhea.      Mouth/Throat: Mucous membranes are moist.      Neck: No stridor. Hematological/Lymphatic/Immunilogical: No cervical lymphadenopathy. Cardiovascular: Normal rate, regular rhythm.  No murmurs, rubs, or gallops. Respiratory: Normal respiratory effort without tachypnea nor retractions. Breath sounds are clear and equal bilaterally. No wheezes/rales/rhonchi. Gastrointestinal: Soft and non tender. No rebound. No guarding.   Genitourinary: Deferred Musculoskeletal: Normal range of motion in all extremities. No lower extremity edema. No spinal tenderness.  Neurologic:  Normal speech and language. No gross focal neurologic deficits are appreciated.  Skin:  Skin is warm, dry and intact. No rash noted. Psychiatric: Mood and affect are normal. Speech and behavior are normal. Patient exhibits appropriate insight and judgment.  ____________________________________________    LABS (pertinent positives/negatives)  None  ____________________________________________   EKG  None  ____________________________________________    RADIOLOGY  None  ____________________________________________   PROCEDURES  Procedures  ____________________________________________   INITIAL IMPRESSION / ASSESSMENT AND PLAN / ED COURSE  Pertinent labs & imaging results that were available during my care of the patient were reviewed by me and considered in my medical decision making (see chart for details).   Patient presented to the emergency department today because of concerns for continued right leg pain, sciatica and hoping to obtain an MRI.  He states he has been working on getting an MRI as an outpatient however due to insurance concerns has not been able to do this at this time.  Patient denies any change in bladder or bowel habits.  This point I do not feel emergent MRI is warranted.  Will trial the patient on gabapentin will give patient steroid course for sciatica.  Discussed importance of continued follow-up.  ____________________________________________   FINAL CLINICAL IMPRESSION(S) / ED DIAGNOSES  Final diagnoses:  Right leg pain     Note: This dictation was prepared with Dragon dictation. Any transcriptional errors that result from this process are unintentional     03/01/19, MD 02/27/19 [phone]

## 2019-11-03 ENCOUNTER — Other Ambulatory Visit: Payer: Self-pay | Admitting: Orthopedic Surgery

## 2019-11-06 ENCOUNTER — Inpatient Hospital Stay: Admission: RE | Admit: 2019-11-06 | Payer: BC Managed Care – PPO | Source: Ambulatory Visit

## 2019-11-06 ENCOUNTER — Other Ambulatory Visit: Payer: Self-pay | Admitting: Orthopedic Surgery

## 2019-11-08 ENCOUNTER — Other Ambulatory Visit: Payer: Self-pay

## 2019-11-08 ENCOUNTER — Encounter
Admission: RE | Admit: 2019-11-08 | Discharge: 2019-11-08 | Disposition: A | Discharge status: Home / Self Care | Payer: BC Managed Care – PPO | Source: Ambulatory Visit | Attending: Orthopedic Surgery | Admitting: Orthopedic Surgery

## 2019-11-08 DIAGNOSIS — Z01818 Encounter for other preprocedural examination: Secondary | ICD-10-CM | POA: Insufficient documentation

## 2019-11-08 HISTORY — DX: Headache, unspecified: R51.9

## 2019-11-08 HISTORY — DX: Unspecified glaucoma: H40.9

## 2019-11-08 HISTORY — DX: Unspecified osteoarthritis, unspecified site: M19.90

## 2019-11-08 HISTORY — DX: Gastro-esophageal reflux disease without esophagitis: K21.9

## 2019-11-08 HISTORY — DX: Bradycardia, unspecified: R00.1

## 2019-11-08 NOTE — Patient Instructions (Addendum)
Your procedure is scheduled on: 11-15-19 [facility] Report to Same Day Surgery 2nd floor medical mall [facility] Entrance-take elevator on left to 2nd floor.  Check in with surgery information desk.) To find out your arrival time please call [phone] between 1PM - 3PM on 11-14-19 TUESDAY  Remember: Instructions that are not followed completely may result in serious medical risk, up to and including death, or upon the discretion of your surgeon and anesthesiologist your surgery may need to be rescheduled.    _x___ 1. Do not eat food after midnight the night before your procedure. NO GUM OR CANDY AFTER MIDNIGHT. You may drink clear liquids up to 2 hours before you are scheduled to arrive at the hospital for your procedure.  Do not drink clear liquids within 2 hours of your scheduled arrival to the hospital.  Clear liquids include  --Water or Apple juice without pulp  --Gatorade  --Black Coffee or Clear Tea (No milk, no creamers, do not add anything to the coffee or Tea-OK TO ADD SUGAR)     __x__ 2. No Alcohol for 24 hours before or after surgery.   __x__3. No Smoking or e-cigarettes for 24 prior to surgery.  Do not use any chewable tobacco products for at least 6 hour prior to surgery   ____  4. Bring all medications with you on the day of surgery if instructed.    __x__ 5. Notify your doctor if there is any change in your medical condition     (cold, fever, infections).    x___6. On the morning of surgery brush your teeth with toothpaste and water.  You may rinse your mouth with mouth wash if you wish.  Do not swallow any toothpaste or mouthwash.   Do not wear jewelry, make-up, hairpins, clips or nail polish.  Do not wear lotions, powders, or perfumes.   Do not shave 48 hours prior to surgery. Men may shave face and neck.  Do not bring valuables to the hospital.    [facility] is not responsible for any belongings or valuables.               Contacts, dentures or bridgework may  not be worn into surgery.  Leave your suitcase in the car. After surgery it may be brought to your room.  For patients admitted to the hospital, discharge time is determined by your treatment team.  _  Patients discharged the day of surgery will not be allowed to drive home.  You will need someone to drive you home and stay with you the night of your procedure.    Please read over the following fact sheets that you were given:   [facility] Preparing for Surgery and MRSA Information/INCENTIVE SPIROMETER INSTRUCTIONS    _X___ TAKE THE FOLLOWING MEDICATION THE MORNING OF SURGERY WITH A SMALL SIP OF WATER. These include:  1. YOU MAY USE YOUR MORNING EYEDROPS THE MORNING OF YOUR SURGERY  2.  3.  4.  5.  6.  ____Fleets enema or Magnesium Citrate as directed.   _x___ Use CHG Soap or sage wipes as directed on instruction sheet   ____ Use inhalers on the day of surgery and bring to hospital day of surgery  ____ Stop Metformin and Janumet 2 days prior to surgery.    ____ Take 1/2 of usual insulin dose the night before surgery and none on the morning surgery.   ____ Follow recommendations from Cardiologist, Pulmonologist or PCP regarding stopping Aspirin, Coumadin, Plavix ,Eliquis,  Effient, or Pradaxa, and Pletal.  X____Stop Anti-inflammatories such as Advil, Aleve, Ibuprofen, Motrin, Naproxen,MOBIC (MELOXICAM) Naprosyn, Goodies powders or aspirin products NOW-OK to take Tylenol    ____ Stop supplements until after surgery.     ____ Bring C-Pap to the hospital.

## 2019-11-10 ENCOUNTER — Other Ambulatory Visit: Payer: Self-pay

## 2019-11-10 ENCOUNTER — Encounter
Admission: RE | Admit: 2019-11-10 | Discharge: 2019-11-10 | Disposition: A | Discharge status: Home / Self Care | Payer: BLUE CROSS/BLUE SHIELD | Source: Ambulatory Visit | Attending: Orthopedic Surgery | Admitting: Orthopedic Surgery

## 2019-11-10 ENCOUNTER — Encounter: Payer: Self-pay | Admitting: Urgent Care

## 2019-11-10 DIAGNOSIS — Z01818 Encounter for other preprocedural examination: Secondary | ICD-10-CM | POA: Diagnosis present

## 2019-11-10 DIAGNOSIS — R001 Bradycardia, unspecified: Secondary | ICD-10-CM | POA: Insufficient documentation

## 2019-11-10 LAB — BASIC METABOLIC PANEL
Anion gap: 8 (ref 5–15)
BUN: 14 mg/dL (ref 6–20)
CO2: 27 mmol/L (ref 22–32)
Calcium: 9.2 mg/dL (ref 8.9–10.3)
Chloride: 104 mmol/L (ref 98–111)
Creatinine, Ser: 0.96 mg/dL (ref 0.61–1.24)
GFR calc Af Amer: >60 mL/min (ref >60)
GFR calc non Af Amer: >60 mL/min (ref >60)
Glucose, Bld: 96 mg/dL (ref 70–99)
Potassium: 3.9 mmol/L (ref 3.5–5.1)
Sodium: 139 mmol/L (ref 135–145)

## 2019-11-10 LAB — CBC
HCT: 42.2 % (ref 39.0–52.0)
Hemoglobin: 14.4 g/dL (ref 13.0–17.0)
MCH: 30.1 pg (ref 26.0–34.0)
MCHC: 34.1 g/dL (ref 30.0–36.0)
MCV: 88.1 fL (ref 80.0–100.0)
Platelets: 208 10*3/uL (ref 150–400)
RBC: 4.79 MIL/uL (ref 4.22–5.81)
RDW: 13.8 % (ref 11.5–15.5)
WBC: 3.2 10*3/uL — ABNORMAL LOW (ref 4.0–10.5)
nRBC: 0 % (ref 0.0–0.2)

## 2019-11-10 LAB — URINALYSIS, ROUTINE W REFLEX MICROSCOPIC
Bilirubin Urine: NEGATIVE
Glucose, UA: NEGATIVE mg/dL
Hgb urine dipstick: NEGATIVE
Ketones, ur: NEGATIVE mg/dL
Leukocytes,Ua: NEGATIVE
Nitrite: NEGATIVE
Protein, ur: NEGATIVE mg/dL
Specific Gravity, Urine: 1.019 (ref 1.005–1.030)
pH: 6 (ref 5.0–8.0)

## 2019-11-10 LAB — TYPE AND SCREEN
ABO/RH(D): A POS
Antibody Screen: NEGATIVE

## 2019-11-10 LAB — APTT: aPTT: 31 seconds (ref 24–36)

## 2019-11-10 LAB — PROTIME-INR
INR: 1 (ref 0.8–1.2)
Prothrombin Time: 12.7 seconds (ref 11.4–15.2)

## 2019-11-10 LAB — SURGICAL PCR SCREEN
MRSA, PCR: NEGATIVE
Staphylococcus aureus: NEGATIVE

## 2019-11-14 ENCOUNTER — Other Ambulatory Visit
Admission: RE | Admit: 2019-11-14 | Discharge: 2019-11-14 | Disposition: A | Discharge status: Home / Self Care | Payer: BLUE CROSS/BLUE SHIELD | Source: Ambulatory Visit | Attending: Orthopedic Surgery | Admitting: Orthopedic Surgery

## 2019-11-14 ENCOUNTER — Other Ambulatory Visit: Payer: Self-pay

## 2019-11-14 DIAGNOSIS — Z20822 Contact with and (suspected) exposure to covid-19: Secondary | ICD-10-CM | POA: Insufficient documentation

## 2019-11-14 DIAGNOSIS — Z01812 Encounter for preprocedural laboratory examination: Secondary | ICD-10-CM | POA: Diagnosis not present

## 2019-11-14 LAB — SARS CORONAVIRUS 2 (TAT 6-24 HRS): SARS Coronavirus 2: NEGATIVE

## 2019-11-14 NOTE — Progress Notes (Signed)
  [facility] Perioperative Services: Pre-Admission/Anesthesia Testing      Date: 11/10/2019  Name: Maurice Perez MRN:   [id number]  Re: ECG  Patient presents for pre-operative testing. Routine ECG showed a marked SB at a rate of 44 bpm. Patient with a PMH (+) for SB. He presents today asymptomatic. EMR reviewed. Patient has no significant medical history available for review other that the aforementioned bradycardia. He has no PCP. Care over the last few years has been rendered in the ED. ED notes reviewed. Patient with intermittent episodes of bradycardia, with the lowest rate being documented at 48 bpm (range 48-90 bpm).   Discussed patient with attending anesthesiologist on call Pernell Dupre, MD). In light of the fact that patient is asymptomatic and has no major medical issues the SB seen on EKG should not be an issue for his planned upcoming anesthetic course. Plans are to proceed with surgery next week.   Quentin Mulling, MSN, APRN, FNP-C, CEN [facility]  Peri-operative Services Nurse Practitioner Phone: [phone]

## 2019-11-15 ENCOUNTER — Ambulatory Visit: Payer: BLUE CROSS/BLUE SHIELD

## 2019-11-15 ENCOUNTER — Other Ambulatory Visit: Payer: Self-pay

## 2019-11-15 ENCOUNTER — Ambulatory Visit: Payer: BLUE CROSS/BLUE SHIELD | Admitting: Certified Registered Nurse Anesthetist

## 2019-11-15 ENCOUNTER — Encounter: Payer: Self-pay | Admitting: Orthopedic Surgery

## 2019-11-15 ENCOUNTER — Observation Stay
Admission: RE | Admit: 2019-11-15 | Discharge: 2019-11-17 | Disposition: A | Discharge status: Home / Self Care | Payer: BLUE CROSS/BLUE SHIELD | Source: Ambulatory Visit | Attending: Orthopedic Surgery | Admitting: Orthopedic Surgery

## 2019-11-15 ENCOUNTER — Encounter
Admission: RE | Disposition: A | Discharge status: Home / Self Care | Payer: Self-pay | Source: Ambulatory Visit | Attending: Orthopedic Surgery

## 2019-11-15 DIAGNOSIS — Z7982 Long term (current) use of aspirin: Secondary | ICD-10-CM | POA: Diagnosis not present

## 2019-11-15 DIAGNOSIS — Z79899 Other long term (current) drug therapy: Secondary | ICD-10-CM | POA: Insufficient documentation

## 2019-11-15 DIAGNOSIS — M1611 Unilateral primary osteoarthritis, right hip: Principal | ICD-10-CM | POA: Insufficient documentation

## 2019-11-15 DIAGNOSIS — Z96651 Presence of right artificial knee joint: Secondary | ICD-10-CM | POA: Insufficient documentation

## 2019-11-15 DIAGNOSIS — Z419 Encounter for procedure for purposes other than remedying health state, unspecified: Secondary | ICD-10-CM

## 2019-11-15 DIAGNOSIS — Z96641 Presence of right artificial hip joint: Secondary | ICD-10-CM

## 2019-11-15 HISTORY — PX: TOTAL HIP ARTHROPLASTY: SHX124

## 2019-11-15 LAB — ABO/RH: ABO/RH(D): A POS

## 2019-11-15 SURGERY — ARTHROPLASTY, HIP, TOTAL, ANTERIOR APPROACH
Anesthesia: Spinal | Site: Hip | Laterality: Right

## 2019-11-15 MED ORDER — ASPIRIN 81 MG PO CHEW
81.0000 mg | CHEWABLE_TABLET | Freq: Two times a day (BID) | ORAL | Status: DC
  Administered 2019-11-15 – 2019-11-17 (×4): 81 mg via ORAL
  Filled 2019-11-15 (×4): qty 1

## 2019-11-15 MED ORDER — PHENOL 1.4 % MT LIQD
1.0000 | OROMUCOSAL | Status: DC | PRN
  Filled 2019-11-15: qty 177

## 2019-11-15 MED ORDER — FAMOTIDINE 20 MG PO TABS: 20.0000 mg | ORAL_TABLET | Freq: Once | ORAL | Status: AC

## 2019-11-15 MED ORDER — BUPIVACAINE-EPINEPHRINE (PF) 0.25% -1:200000 IJ SOLN
INTRAMUSCULAR | Status: DC | PRN
  Administered 2019-11-15: 20 mL

## 2019-11-15 MED ORDER — ACETAMINOPHEN 500 MG PO TABS
500.0000 mg | ORAL_TABLET | Freq: Four times a day (QID) | ORAL | Status: AC
  Administered 2019-11-15 – 2019-11-16 (×4): 500 mg via ORAL
  Filled 2019-11-15 (×4): qty 1

## 2019-11-15 MED ORDER — DORZOLAMIDE HCL 2 % OP SOLN
1.0000 [drp] | Freq: Two times a day (BID) | OPHTHALMIC | Status: DC
  Administered 2019-11-15 – 2019-11-17 (×4): 1 [drp] via OPHTHALMIC
  Filled 2019-11-15: qty 10

## 2019-11-15 MED ORDER — CHLORHEXIDINE GLUCONATE 0.12 % MT SOLN
OROMUCOSAL | Status: AC
  Administered 2019-11-15: 15 mL via OROMUCOSAL
  Filled 2019-11-15: qty 15

## 2019-11-15 MED ORDER — SODIUM CHLORIDE 0.9 % IV SOLN
INTRAVENOUS | Status: DC | PRN
  Administered 2019-11-15: 30 ug/min via INTRAVENOUS

## 2019-11-15 MED ORDER — POVIDONE-IODINE 10 % EX SWAB: 2.0000 "application " | Freq: Once | CUTANEOUS | Status: DC

## 2019-11-15 MED ORDER — MENTHOL 3 MG MT LOZG
1.0000 | LOZENGE | OROMUCOSAL | Status: DC | PRN
  Filled 2019-11-15: qty 9

## 2019-11-15 MED ORDER — FENTANYL CITRATE (PF) 100 MCG/2ML IJ SOLN
INTRAMUSCULAR | Status: DC | PRN
  Administered 2019-11-15 (×2): 50 ug via INTRAVENOUS

## 2019-11-15 MED ORDER — PROPOFOL 500 MG/50ML IV EMUL
INTRAVENOUS | Status: DC | PRN
  Administered 2019-11-15: 50 ug/kg/min via INTRAVENOUS
  Administered 2019-11-15: 40 mg via INTRAVENOUS

## 2019-11-15 MED ORDER — DOCUSATE SODIUM 100 MG PO CAPS
100.0000 mg | ORAL_CAPSULE | Freq: Two times a day (BID) | ORAL | Status: DC
  Administered 2019-11-15 – 2019-11-17 (×4): 100 mg via ORAL
  Filled 2019-11-15 (×4): qty 1

## 2019-11-15 MED ORDER — LACTATED RINGERS IV SOLN: INTRAVENOUS | Status: DC

## 2019-11-15 MED ORDER — CEFAZOLIN SODIUM-DEXTROSE 2-4 GM/100ML-% IV SOLN
2.0000 g | Freq: Four times a day (QID) | INTRAVENOUS | Status: DC
  Administered 2019-11-15: 2 g via INTRAVENOUS

## 2019-11-15 MED ORDER — HYDROCODONE-ACETAMINOPHEN 5-325 MG PO TABS: 1.0000 | ORAL_TABLET | ORAL | Status: DC | PRN

## 2019-11-15 MED ORDER — CEFAZOLIN SODIUM-DEXTROSE 2-4 GM/100ML-% IV SOLN
INTRAVENOUS | Status: AC
  Filled 2019-11-15: qty 100

## 2019-11-15 MED ORDER — FAMOTIDINE 20 MG PO TABS
ORAL_TABLET | ORAL | Status: AC
  Administered 2019-11-15: 20 mg via ORAL
  Filled 2019-11-15: qty 1

## 2019-11-15 MED ORDER — ONDANSETRON HCL 4 MG/2ML IJ SOLN
INTRAMUSCULAR | Status: DC | PRN
  Administered 2019-11-15: 4 mg via INTRAVENOUS

## 2019-11-15 MED ORDER — CHLORHEXIDINE GLUCONATE 0.12 % MT SOLN: 15.0000 mL | Freq: Once | OROMUCOSAL | Status: AC

## 2019-11-15 MED ORDER — TIMOLOL MALEATE 0.5 % OP SOLN
1.0000 [drp] | Freq: Two times a day (BID) | OPHTHALMIC | Status: DC
  Administered 2019-11-15 – 2019-11-17 (×4): 1 [drp] via OPHTHALMIC
  Filled 2019-11-15: qty 5

## 2019-11-15 MED ORDER — ONDANSETRON HCL 4 MG/2ML IJ SOLN: 4.0000 mg | Freq: Once | INTRAMUSCULAR | Status: DC | PRN

## 2019-11-15 MED ORDER — TIMOLOL MALEATE 0.5 % OP SOLN
1.0000 [drp] | Freq: Two times a day (BID) | OPHTHALMIC | Status: DC
  Administered 2019-11-16: 1 [drp] via OPHTHALMIC
  Filled 2019-11-15: qty 5

## 2019-11-15 MED ORDER — BUPIVACAINE HCL (PF) 0.5 % IJ SOLN
INTRAMUSCULAR | Status: DC | PRN
  Administered 2019-11-15: 3 mL

## 2019-11-15 MED ORDER — TRANEXAMIC ACID-NACL 1000-0.7 MG/100ML-% IV SOLN
INTRAVENOUS | Status: AC
  Filled 2019-11-15: qty 100

## 2019-11-15 MED ORDER — ACETAMINOPHEN 10 MG/ML IV SOLN
INTRAVENOUS | Status: DC | PRN
  Administered 2019-11-15: 1000 mg via INTRAVENOUS

## 2019-11-15 MED ORDER — MAGNESIUM CITRATE PO SOLN
1.0000 | Freq: Once | ORAL | Status: DC | PRN
  Filled 2019-11-15 (×2): qty 296

## 2019-11-15 MED ORDER — BUPIVACAINE-EPINEPHRINE (PF) 0.25% -1:200000 IJ SOLN
INTRAMUSCULAR | Status: AC
  Filled 2019-11-15: qty 30

## 2019-11-15 MED ORDER — DORZOLAMIDE HCL-TIMOLOL MAL 2-0.5 % OP SOLN: 1.0000 [drp] | Freq: Two times a day (BID) | OPHTHALMIC | Status: DC

## 2019-11-15 MED ORDER — ALUM & MAG HYDROXIDE-SIMETH 200-200-20 MG/5ML PO SUSP: 30.0000 mL | ORAL | Status: DC | PRN

## 2019-11-15 MED ORDER — MAGNESIUM HYDROXIDE 400 MG/5ML PO SUSP
30.0000 mL | Freq: Every day | ORAL | Status: DC | PRN
  Administered 2019-11-16: 30 mL via ORAL
  Filled 2019-11-15: qty 30

## 2019-11-15 MED ORDER — DEXAMETHASONE SODIUM PHOSPHATE 10 MG/ML IJ SOLN
INTRAMUSCULAR | Status: DC | PRN
Start: 2019-11-15 — End: 2019-11-15
  Administered 2019-11-15: 10 mg via INTRAVENOUS

## 2019-11-15 MED ORDER — CEFAZOLIN SODIUM-DEXTROSE 2-4 GM/100ML-% IV SOLN
2.0000 g | Freq: Four times a day (QID) | INTRAVENOUS | Status: AC
  Administered 2019-11-16: 2 g via INTRAVENOUS
  Filled 2019-11-15: qty 100

## 2019-11-15 MED ORDER — SODIUM CHLORIDE 0.9 % IR SOLN
Status: DC | PRN
  Administered 2019-11-15: 2000 mL

## 2019-11-15 MED ORDER — FENTANYL CITRATE (PF) 100 MCG/2ML IJ SOLN: 25.0000 ug | INTRAMUSCULAR | Status: DC | PRN

## 2019-11-15 MED ORDER — METOCLOPRAMIDE HCL 5 MG/ML IJ SOLN: 5.0000 mg | Freq: Three times a day (TID) | INTRAMUSCULAR | Status: DC | PRN

## 2019-11-15 MED ORDER — GLYCOPYRROLATE 0.2 MG/ML IJ SOLN
INTRAMUSCULAR | Status: DC | PRN
  Administered 2019-11-15: .2 mg via INTRAVENOUS

## 2019-11-15 MED ORDER — FENTANYL CITRATE (PF) 100 MCG/2ML IJ SOLN
INTRAMUSCULAR | Status: AC
  Filled 2019-11-15: qty 2

## 2019-11-15 MED ORDER — KETOROLAC TROMETHAMINE 15 MG/ML IJ SOLN
15.0000 mg | Freq: Four times a day (QID) | INTRAMUSCULAR | Status: AC
  Administered 2019-11-15 – 2019-11-16 (×3): 15 mg via INTRAVENOUS
  Filled 2019-11-15 (×2): qty 1

## 2019-11-15 MED ORDER — ONDANSETRON HCL 4 MG/2ML IJ SOLN: 4.0000 mg | Freq: Four times a day (QID) | INTRAMUSCULAR | Status: DC | PRN

## 2019-11-15 MED ORDER — MIDAZOLAM HCL 5 MG/5ML IJ SOLN
INTRAMUSCULAR | Status: DC | PRN
  Administered 2019-11-15: 2 mg via INTRAVENOUS

## 2019-11-15 MED ORDER — MORPHINE SULFATE (PF) 2 MG/ML IV SOLN
0.5000 mg | INTRAVENOUS | Status: DC | PRN
  Administered 2019-11-17: 1 mg via INTRAVENOUS
  Administered 2019-11-17: 0.5 mg via INTRAVENOUS
  Administered 2019-11-17 (×2): 1 mg via INTRAVENOUS
  Filled 2019-11-15 (×4): qty 1

## 2019-11-15 MED ORDER — LATANOPROST 0.005 % OP SOLN
1.0000 [drp] | Freq: Every day | OPHTHALMIC | Status: DC
  Administered 2019-11-16 (×2): 1 [drp] via OPHTHALMIC
  Filled 2019-11-15 (×2): qty 2.5

## 2019-11-15 MED ORDER — ACETAMINOPHEN 325 MG PO TABS: 325.0000 mg | ORAL_TABLET | Freq: Four times a day (QID) | ORAL | Status: DC | PRN

## 2019-11-15 MED ORDER — ONDANSETRON HCL 4 MG PO TABS: 4.0000 mg | ORAL_TABLET | Freq: Four times a day (QID) | ORAL | Status: DC | PRN

## 2019-11-15 MED ORDER — BISACODYL 10 MG RE SUPP: 10.0000 mg | Freq: Every day | RECTAL | Status: DC | PRN

## 2019-11-15 MED ORDER — ORAL CARE MOUTH RINSE: 15.0000 mL | Freq: Once | OROMUCOSAL | Status: AC

## 2019-11-15 MED ORDER — METOCLOPRAMIDE HCL 10 MG PO TABS: 5.0000 mg | ORAL_TABLET | Freq: Three times a day (TID) | ORAL | Status: DC | PRN

## 2019-11-15 MED ORDER — KETOROLAC TROMETHAMINE 15 MG/ML IJ SOLN
INTRAMUSCULAR | Status: AC
  Filled 2019-11-15: qty 1

## 2019-11-15 MED ORDER — MIDAZOLAM HCL 2 MG/2ML IJ SOLN
INTRAMUSCULAR | Status: AC
  Filled 2019-11-15: qty 2

## 2019-11-15 MED ORDER — HYDROCODONE-ACETAMINOPHEN 7.5-325 MG PO TABS
1.0000 | ORAL_TABLET | ORAL | Status: DC | PRN
  Administered 2019-11-15: 1 via ORAL
  Administered 2019-11-16 (×3): 2 via ORAL
  Administered 2019-11-17: 1 via ORAL
  Administered 2019-11-17: 2 via ORAL
  Filled 2019-11-15 (×2): qty 1
  Filled 2019-11-15 (×4): qty 2
  Filled 2019-11-15: qty 1

## 2019-11-15 MED ORDER — OXYCODONE HCL 5 MG PO TABS: 5.0000 mg | ORAL_TABLET | Freq: Once | ORAL | Status: DC | PRN

## 2019-11-15 MED ORDER — BACITRACIN 50000 UNITS IM SOLR
INTRAMUSCULAR | Status: AC
  Filled 2019-11-15: qty 2

## 2019-11-15 MED ORDER — TRANEXAMIC ACID-NACL 1000-0.7 MG/100ML-% IV SOLN
1000.0000 mg | INTRAVENOUS | Status: AC
  Administered 2019-11-15: 1000 mg via INTRAVENOUS

## 2019-11-15 MED ORDER — OXYCODONE HCL 5 MG/5ML PO SOLN: 5.0000 mg | Freq: Once | ORAL | Status: DC | PRN

## 2019-11-15 MED ORDER — CEFAZOLIN SODIUM-DEXTROSE 2-4 GM/100ML-% IV SOLN
2.0000 g | INTRAVENOUS | Status: AC
  Administered 2019-11-15: 2 g via INTRAVENOUS

## 2019-11-15 SURGICAL SUPPLY — 49 items
BLADE SAGITTAL WIDE XTHICK NO (BLADE) ×3 IMPLANT
BRUSH SCRUB EZ  4% CHG (MISCELLANEOUS) ×4
BRUSH SCRUB EZ 4% CHG (MISCELLANEOUS) ×2 IMPLANT
CHLORAPREP W/TINT 26 (MISCELLANEOUS) ×3 IMPLANT
COVER HOLE (Hips) ×3 IMPLANT
COVER WAND RF STERILE (DRAPES) ×3 IMPLANT
DRAPE 3/4 80X56 (DRAPES) ×3 IMPLANT
DRAPE C-ARM 42X72 X-RAY (DRAPES) ×3 IMPLANT
DRAPE STERI IOBAN 125X83 (DRAPES) IMPLANT
DRSG AQUACEL AG ADV 3.5X10 (GAUZE/BANDAGES/DRESSINGS) IMPLANT
DRSG AQUACEL AG ADV 3.5X14 (GAUZE/BANDAGES/DRESSINGS) IMPLANT
ELECT BLADE 6.5 EXT (BLADE) ×3 IMPLANT
ELECT REM PT RETURN 9FT ADLT (ELECTROSURGICAL) ×3
ELECTRODE REM PT RTRN 9FT ADLT (ELECTROSURGICAL) ×1 IMPLANT
GAUZE XEROFORM 1X8 LF (GAUZE/BANDAGES/DRESSINGS) IMPLANT
GLOVE INDICATOR 8.0 STRL GRN (GLOVE) ×3 IMPLANT
GLOVE SURG ORTHO 8.0 STRL STRW (GLOVE) ×6 IMPLANT
GOWN STRL REUS W/ TWL LRG LVL3 (GOWN DISPOSABLE) ×1 IMPLANT
GOWN STRL REUS W/ TWL XL LVL3 (GOWN DISPOSABLE) ×1 IMPLANT
GOWN STRL REUS W/TWL LRG LVL3 (GOWN DISPOSABLE) ×2
GOWN STRL REUS W/TWL XL LVL3 (GOWN DISPOSABLE) ×2
HEAD OXINIUM PLUS 0 32MM (Hips) ×3 IMPLANT
HOOD PEEL AWAY FLYTE STAYCOOL (MISCELLANEOUS) ×9 IMPLANT
IV NS 1000ML (IV SOLUTION) ×2
IV NS 1000ML BAXH (IV SOLUTION) ×1 IMPLANT
KIT PATIENT CARE HANA TABLE (KITS) ×3 IMPLANT
KIT TURNOVER CYSTO (KITS) ×3 IMPLANT
LINER 3H HEMI SHELL 48MM (Liner) ×3 IMPLANT
LINER ACETABULAR 32X48 (Liner) ×3 IMPLANT
MAT ABSORB  FLUID 56X50 GRAY (MISCELLANEOUS) ×2
MAT ABSORB FLUID 56X50 GRAY (MISCELLANEOUS) ×1 IMPLANT
NDL SAFETY ECLIPSE 18X1.5 (NEEDLE) ×2 IMPLANT
NEEDLE HYPO 18GX1.5 SHARP (NEEDLE) ×4
NEEDLE HYPO 22GX1.5 SAFETY (NEEDLE) ×3 IMPLANT
NEEDLE SPNL 20GX3.5 QUINCKE YW (NEEDLE) ×3 IMPLANT
PACK HIP PROSTHESIS (MISCELLANEOUS) ×3 IMPLANT
PADDING CAST BLEND 4X4 NS (MISCELLANEOUS) ×6 IMPLANT
PILLOW ABDUCTION MEDIUM (MISCELLANEOUS) ×3 IMPLANT
PULSAVAC PLUS IRRIG FAN TIP (DISPOSABLE) ×3
SCREW 6.5X25MM (Screw) ×3 IMPLANT
STAPLER SKIN PROX 35W (STAPLE) ×3 IMPLANT
STEM STD COLLAR SZ1 POLARSTEM (Stem) ×3 IMPLANT
SUT BONE WAX W31G (SUTURE) ×3 IMPLANT
SUT DVC 2 QUILL PDO  T11 36X36 (SUTURE) ×2
SUT DVC 2 QUILL PDO T11 36X36 (SUTURE) ×1 IMPLANT
SUT VIC AB 2-0 CT1 18 (SUTURE) ×3 IMPLANT
SYR 20ML LL LF (SYRINGE) ×3 IMPLANT
SYR BULB EAR ULCER 3OZ GRN STR (SYRINGE) ×3 IMPLANT
TIP FAN IRRIG PULSAVAC PLUS (DISPOSABLE) ×1 IMPLANT

## 2019-11-15 NOTE — Anesthesia Preprocedure Evaluation (Addendum)
Anesthesia Evaluation  Patient identified by MRN, date of birth, ID band Patient awake    Reviewed: Allergy & Precautions, H&P , NPO status , Patient's Chart, lab work & pertinent test results  History of Anesthesia Complications Negative for: history of anesthetic complications  Airway Mallampati: III  TM Distance: >3 FB Neck ROM: full    Dental  (+) Chipped   Pulmonary neg pulmonary ROS, neg COPD, former smoker,    breath sounds clear to auscultation       Cardiovascular (-) angina(-) Past MI and (-) Cardiac Stents + dysrhythmias (h/o asymptomatic bradycardia)  Rhythm:regular Rate:Normal     Neuro/Psych  Headaches, negative psych ROS   GI/Hepatic GERD  Controlled,(+)     substance abuse (drinks about 6 beers and 3 shots whiskey)  alcohol use,   Endo/Other  negative endocrine ROS  Renal/GU      Musculoskeletal   Abdominal   Peds  Hematology negative hematology ROS (+)   Anesthesia Other Findings Past Medical History: No date: Arthritis No date: Bradycardia No date: GERD (gastroesophageal reflux disease) No date: Glaucoma No date: Headache     Comment:  H/O MIGRAINES  Past Surgical History: No date: EYE SURGERY     Comment:  AS A CHILD No date: FRACTURE SURGERY; Left     Comment:  LLE     Reproductive/Obstetrics negative OB ROS                           Anesthesia Physical Anesthesia Plan  ASA: III  Anesthesia Plan: Spinal   Post-op Pain Management:    Induction:   PONV Risk Score and Plan: Propofol infusion  Airway Management Planned: Natural Airway and Simple Face Mask  Additional Equipment:   Intra-op Plan:   Post-operative Plan:   Informed Consent: I have reviewed the patients History and Physical, chart, labs and discussed the procedure including the risks, benefits and alternatives for the proposed anesthesia with the patient or authorized representative  who has indicated his/her understanding and acceptance.     Dental Advisory Given  Plan Discussed with: Anesthesiologist, CRNA and Surgeon  Anesthesia Plan Comments:       Anesthesia Quick Evaluation

## 2019-11-15 NOTE — Transfer of Care (Signed)
Immediate Anesthesia Transfer of Care Note  Patient: Maurice Perez  Procedure(s) Performed: TOTAL HIP ARTHROPLASTY ANTERIOR APPROACH (Right Hip)  Patient Location: PACU  Anesthesia Type:Spinal  Level of Consciousness: awake, drowsy and patient cooperative  Airway & Oxygen Therapy: Patient Spontanous Breathing and Patient connected to face mask oxygen  Post-op Assessment: Report given to RN and Post -op Vital signs reviewed and stable  Post vital signs: Reviewed and stable  Last Vitals:  Vitals Value Taken Time  BP 137/81 11/15/19 1513  Temp    Pulse 46 11/15/19 1514  Resp 18 11/15/19 1514  SpO2 100 % 11/15/19 1514  Vitals shown include unvalidated device data.  Last Pain:  Vitals:   11/15/19 1114  TempSrc: Temporal  PainSc: 6       Patients Stated Pain Goal: 0 (11/15/19 1114)  Complications: No complications documented.

## 2019-11-15 NOTE — Progress Notes (Signed)
PT Cancellation Note  Patient Details Name: Maurice Perez MRN: [id number] DOB: January 16, 1971   Cancelled Treatment:    Reason Eval/Treat Not Completed: Patient not medically ready; Per nursing pt not yet appropriate for PT services secondary to decreased strength/sensation to operative leg. Will attempt to see pt at a future date/time as medically appropriate.     Ovidio Hanger PT, DPT 11/15/19, 4:09 PM

## 2019-11-15 NOTE — H&P (Signed)
The patient has been re-examined, and the chart reviewed, and there have been no interval changes to the documented history and physical.  Plan a right total hip today.  Anesthesia is not consulted regarding a peripheral nerve block for post-operative pain.  The risks, benefits, and alternatives have been discussed at length, and the patient is willing to proceed.    

## 2019-11-15 NOTE — Op Note (Signed)
11/15/2019  3:07 PM  PATIENT:  ISIDORE MARGRAF   MRN: [id number]  PRE-OPERATIVE DIAGNOSIS:  Osteoarthritis right hip   POST-OPERATIVE DIAGNOSIS: Same  Procedure: Right Total Hip Replacement  Surgeon: Dola Argyle. Odis Luster, MD   Assist: Altamese Cabal, PA-C  Anesthesia: Spinal   EBL: 200 mL   Specimens: None   Drains: None   Components used: A size 1 Polarstem Smith and Nephew, R3 size 48 mm shell, and a 32 mm +0 mm head    Description of the procedure in detail: After informed consent was obtained and the appropriate extremity marked in the pre-operative holding area, the patient was taken to the operating room and placed in the supine position on the fracture table. All pressure points were well padded and bilateral lower extremities were place in traction spars. The hip was prepped and draped in standard sterile fashion. A spinal anesthetic had been delivered by the anesthesia team. The skin and subcutaneous tissues were injected with a mixture of Marcaine with epinephrine for post-operative pain. A longitudinal incision approximately 10 cm in length was carried out from the anterior superior iliac spine to the greater trochanter. The tensor fascia was divided and blunt dissection was taken down to the level of the joint capsule. The lateral circumflex vessels were cauterized. Deep retractors were placed and a portion of the anterior capsule was excised. Using fluoroscopy the neck cut was planned and carried out with a sagittal saw. The head was passed from the field with use of a corkscrew and hip skid. Deep retractors were placed along the acetabulum and the degenerative labrum and large osteophytes were removed with a Rongeur. The cup was sequentially reamed to a size 48 mm. The wound was irrigated and using fluoroscopy the size 48 mm cup was impacted in to anatomic position. A single screw was placed followed by a threaded hole cover. The final liner was impacted in to position. Attention  was then turned to the proximal femur. The leg was placed in extension and external rotation. The canal was opened and sequentially broached to a size 1. The trial components were placed and the hip relocated. The components were found to be in good position using fluoroscopy. The hip was dislocated and the trial components removed. The final components were impacted in to position and the hip relocated. The final components were again check with fluoroscopy and found to be in good position. Hemostasis was achieved with electrocautery. The deep capsule was injected with Marcaine and epinephrine. The wound was irrigated with bacitracin laced normal saline and the tensor fascia closed with #2 Quill suture. The subcutaneous tissues were closed with 2-0 vicryl and staples for the skin. A sterile dressing was applied and an abduction pillow. Patient tolerated the procedure well and there were no apparent complication. Patient was taken to the recovery room in good condition.   Cassell Smiles, MD

## 2019-11-15 NOTE — Anesthesia Procedure Notes (Signed)
Spinal  Patient location during procedure: OR Staffing Performed: resident/CRNA  Anesthesiologist: Tera Mater, MD Resident/CRNA: Norm Salt, RN Preanesthetic Checklist Completed: patient identified, IV checked, site marked, risks and benefits discussed, surgical consent, monitors and equipment checked, pre-op evaluation and timeout performed Spinal Block Patient position: sitting Prep: ChloraPrep Patient monitoring: heart rate, continuous pulse ox, blood pressure and cardiac monitor Approach: midline Location: L4-5 Injection technique: single-shot Needle Needle type: Whitacre and Introducer  Needle gauge: 24 G Needle length: 9 cm Additional Notes Negative paresthesia. Negative blood return. Positive free-flowing CSF. Expiration date of kit checked and confirmed. Patient tolerated procedure well, without complications.

## 2019-11-16 ENCOUNTER — Encounter: Payer: Self-pay | Admitting: Orthopedic Surgery

## 2019-11-16 DIAGNOSIS — M1611 Unilateral primary osteoarthritis, right hip: Secondary | ICD-10-CM | POA: Diagnosis not present

## 2019-11-16 LAB — BASIC METABOLIC PANEL
Anion gap: 10 (ref 5–15)
BUN: 12 mg/dL (ref 6–20)
CO2: 22 mmol/L (ref 22–32)
Calcium: 9.1 mg/dL (ref 8.9–10.3)
Chloride: 104 mmol/L (ref 98–111)
Creatinine, Ser: 0.86 mg/dL (ref 0.61–1.24)
GFR calc Af Amer: >60 mL/min (ref >60)
GFR calc non Af Amer: >60 mL/min (ref >60)
Glucose, Bld: 118 mg/dL — ABNORMAL HIGH (ref 70–99)
Potassium: 4.1 mmol/L (ref 3.5–5.1)
Sodium: 136 mmol/L (ref 135–145)

## 2019-11-16 LAB — CBC
HCT: 40 % (ref 39.0–52.0)
Hemoglobin: 14.1 g/dL (ref 13.0–17.0)
MCH: 30.7 pg (ref 26.0–34.0)
MCHC: 35.3 g/dL (ref 30.0–36.0)
MCV: 87 fL (ref 80.0–100.0)
Platelets: 223 10*3/uL (ref 150–400)
RBC: 4.6 MIL/uL (ref 4.22–5.81)
RDW: 14.3 % (ref 11.5–15.5)
WBC: 9.5 10*3/uL (ref 4.0–10.5)
nRBC: 0 % (ref 0.0–0.2)

## 2019-11-16 MED ORDER — ONDANSETRON HCL 4 MG PO TABS: 4.0000 mg | ORAL_TABLET | Freq: Four times a day (QID) | ORAL | 0 refills | Status: DC | PRN

## 2019-11-16 MED ORDER — ASPIRIN 81 MG PO CHEW: 81.0000 mg | CHEWABLE_TABLET | Freq: Two times a day (BID) | ORAL | 0 refills | Status: AC

## 2019-11-16 MED ORDER — DOCUSATE SODIUM 100 MG PO CAPS: 100.0000 mg | ORAL_CAPSULE | Freq: Two times a day (BID) | ORAL | 0 refills | Status: AC

## 2019-11-16 MED ORDER — HYDROCODONE-ACETAMINOPHEN 5-325 MG PO TABS: 1.0000 | ORAL_TABLET | ORAL | 0 refills | Status: DC | PRN

## 2019-11-16 NOTE — Progress Notes (Signed)
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  PMH includes: bradycardia, glaucoma, and GERD.    PT Comments    Pt pleasant and motivated to participate during the session. Pt had decent carryover with bed/sitting exercises such as ankle pumps, quad sets, and glute sets and required minimal cueing for these exercises. Pt required less cueing this afternoon for sit-stand transfer and was able to perform with min cueing for hand placement. Pt was additionally able to ambulate ~197ft this afternoon with min-mod cueing to keep his feet within the walker. Pt continues to have difficulty retaining cues for movement sequencing when ascending/descending stairs and required max cueing and guarding in order to perform safely. Verbal instruction and demonstration was provided regarding car transfers to prepare for future d/c. Pt's fiance is planning on attending PT session tomorrow, prior to d/c, such that she can participate with stair training and car transfer education in order to help pt have a safe transition home. Pt will benefit from HHPT upon discharge to safely address deficits listed in patient problem list for decreased caregiver assistance and eventual return to PLOF.   Follow Up Recommendations  Home health PT;Supervision - Intermittent     Equipment Recommendations  Rolling walker with 5" wheels    Recommendations for Other Services       Precautions / Restrictions Precautions Precautions: Fall;Anterior Hip Precaution Booklet Issued: Yes (comment) Restrictions Weight Bearing Restrictions: Yes RLE Weight Bearing: Weight bearing as tolerated    Mobility  Bed Mobility Overal bed mobility: Needs Assistance Bed Mobility: Supine to Sit     Supine to sit: Supervision     General bed mobility comments: NT -  patient found in recliner  Transfers Overall transfer level: Needs assistance Equipment used: Rolling walker (2 wheeled) Transfers: Sit to/from Stand Sit to Stand: Min guard         General transfer comment: Min verbal cues for hand positioning  Ambulation/Gait Ambulation/Gait assistance: Min guard Gait Distance (Feet): 150 Feet Assistive device: Rolling walker (2 wheeled) Gait Pattern/deviations: Step-to pattern;Decreased step length - left;Decreased stance time - right;Antalgic Gait velocity: decreased   General Gait Details: min to mod verbal cues for sequencing or amb closer to the RW and for hand placement   Stairs Stairs: Yes Stairs assistance: Min guard Stair Management: One rail Left;Forwards Number of Stairs: 4 General stair comments: Pt able to ascend/descend 4 steps with one rail with max verbal cues for proper sequencing with poor carryover during training but good eccentric/concentric control   Wheelchair Mobility    Modified Rankin (Stroke Patients Only)       Balance Overall balance assessment: Needs assistance Sitting-balance support: Feet unsupported;No upper extremity supported Sitting balance-Leahy Scale: Good     Standing balance support: Bilateral upper extremity supported;During functional activity Standing balance-Leahy Scale: Good Standing balance comment: Min support from the RW during functional mobility                            Cognition Arousal/Alertness: Awake/alert Behavior During Therapy: WFL for tasks assessed/performed Overall Cognitive Status: Within Functional Limits for tasks assessed  General Comments: some difficulty with following cues regarding movement sequencing (stairs)      Exercises Total Joint Exercises Ankle Circles/Pumps: AROM;Strengthening;Both;10 reps Quad Sets: Strengthening;Both;10 reps Gluteal Sets: Strengthening;Both;10 reps Hip  ABduction/ADduction: AROM;Strengthening;Right;10 reps Long Arc Quad: AROM;Strengthening;Both;10 reps;15 reps Knee Flexion: AROM;Strengthening;Both;10 reps;15 reps Marching in Standing: AROM;Strengthening;Both;10 reps;Standing Other Exercises Other Exercises: Anterior hip precaution education Other Exercises: Transfer training to/from various height surfaces with cues for sequencing Other Exercises: Gait training with cues for proper sequencing Other Exercises: HEP education per handout Other Exercises: Verbal education on how to complete transfers in/out of car w/ visual simulation using room chair    General Comments        Pertinent Vitals/Pain Pain Assessment: 0-10 Pain Score: 5  Pain Location: R hip Pain Descriptors / Indicators: Aching;Sore Pain Intervention(s): Monitored during session;Premedicated before session;Repositioned    Home Living Family/patient expects to be discharged to:: Private residence Living Arrangements: Spouse/significant other Available Help at Discharge: Family;Available 24 hours/day Type of Home: Apartment Home Access: Stairs to enter Entrance Stairs-Rails: Left Home Layout: One level Home Equipment: Toilet riser Additional Comments: Toilet riser has arm rests    Prior Function Level of Independence: Independent      Comments: Ind amb without an AD, walks 3 miles per day for work, no fall history, Ind with ADLs   PT Goals (current goals can now be found in the care plan section) Acute Rehab PT Goals Patient Stated Goal: To walk better PT Goal Formulation: With patient Time For Goal Achievement: 11/29/19 Potential to Achieve Goals: Good Progress towards PT goals: Progressing toward goals    Frequency    BID      PT Plan Current plan remains appropriate    Co-evaluation              AM-PAC PT "6 Clicks" Mobility   Outcome Measure  Help needed turning from your back to your side while in a flat bed without using bedrails?: A  Little Help needed moving from lying on your back to sitting on the side of a flat bed without using bedrails?: A Little Help needed moving to and from a bed to a chair (including a wheelchair)?: A Little Help needed standing up from a chair using your arms (e.g., wheelchair or bedside chair)?: A Little Help needed to walk in hospital room?: A Little Help needed climbing 3-5 steps with a railing? : A Little 6 Click Score: 18    End of Session Equipment Utilized During Treatment: Gait belt Activity Tolerance: Patient tolerated treatment well Patient left: in chair;with call bell/phone within reach;with chair alarm set;with SCD's reapplied;Other (comment) Nurse Communication: Mobility status;Weight bearing status PT Visit Diagnosis: Muscle weakness (generalized) (M62.81);Other abnormalities of gait and mobility (R26.89);Pain Pain - Right/Left: Right Pain - part of body: Hip     Time: 6384-6659 PT Time Calculation (min) (ACUTE ONLY): 38 min  Charges:  $Gait Training: 23-37 mins $Therapeutic Exercise: 8-22 mins                     D. Scott Vishnu Moeller PT, DPT 11/16/19, 3:36 PM

## 2019-11-16 NOTE — Discharge Instructions (Signed)

## 2019-11-16 NOTE — Plan of Care (Signed)
  Problem: Activity: Goal: Risk for activity intolerance will decrease Outcome: Progressing   Problem: Pain Managment: Goal: General experience of comfort will improve Outcome: Progressing   

## 2019-11-16 NOTE — Evaluation (Signed)
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  PMH includes: bradycardia, glaucoma, and GERD.    Clinical Impression  Pt pleasant and motivated to participate during the session.  Pt did not require physical assistance with any functional task this session.  Pt did require extra time and effort as well as heavy cuing occasionally in order to perform tasks safely and demonstrated poor carryover at times during training.  Pt with a h/o asymptomatic bradycardia with resting HR found to be around 50 bpm but increased to low 70s with activity without symptoms.  Pt's SpO2 WNL throughout.  Pt will benefit from HHPT services upon discharge to safely address deficits listed in patient problem list for decreased caregiver assistance and eventual return to PLOF.      Follow Up Recommendations Home health PT;Supervision - Intermittent    Equipment Recommendations  Rolling walker with 5" wheels    Recommendations for Other Services       Precautions / Restrictions Precautions Precautions: Fall;Anterior Hip Precaution Booklet Issued: Yes (comment) Restrictions Weight Bearing Restrictions: Yes RLE Weight Bearing: Weight bearing as tolerated      Mobility  Bed Mobility Overal bed mobility: Needs Assistance Bed Mobility: Supine to Sit     Supine to sit: Supervision     General bed mobility comments: Extra time and effort with min verbal cues for sequencing  Transfers Overall transfer level: Needs assistance Equipment used: Rolling walker (2 wheeled) Transfers: Sit to/from Stand Sit to Stand: Min guard         General transfer comment: Mod verbal and tactile cues for sequencing  Ambulation/Gait Ambulation/Gait assistance: Min guard Gait Distance (Feet): 80 Feet x 1, 30 Feet x 1 Assistive device: Rolling  walker (2 wheeled) Gait Pattern/deviations: Step-to pattern;Decreased step length - left;Decreased stance time - right;Antalgic Gait velocity: decreased   General Gait Details: Mod to max verbal cues for sequencing or amb closer to the RW and for hand placement  Stairs Stairs: Yes Stairs assistance: Min guard Stair Management: One rail Left;Forwards;Two rails Number of Stairs: 4 General stair comments: Pt able to ascend/descend 2 steps with B rails and then 4 steps with one rail with max verbal cues for proper sequencing with poor carryover during training but with fair eccentric/concentric control  Wheelchair Mobility    Modified Rankin (Stroke Patients Only)       Balance Overall balance assessment: Needs assistance   Sitting balance-Leahy Scale: Good     Standing balance support: Bilateral upper extremity supported;During functional activity Standing balance-Leahy Scale: Good Standing balance comment: Min support from the RW during functional mobility                             Pertinent Vitals/Pain Pain Assessment: 0-10 Pain Score: 8  Pain Location: R hip Pain Descriptors / Indicators: Aching;Sore Pain Intervention(s): Premedicated before session;Monitored during session    Home Living Family/patient expects to be discharged to:: Private residence Living Arrangements: Spouse/significant other Available Help at Discharge: Family;Available 24 hours/day Type of Home: Apartment Home Access: Stairs to enter Entrance Stairs-Rails: Left Entrance Stairs-Number of Steps: 5 Home Layout: One level Home Equipment: Toilet riser Additional Comments: Toilet riser has arm rests    Prior Function Level of Independence: Independent  Comments: Ind amb without an AD, walks 3 miles per day for work, no fall history, Ind with ADLs     Hand Dominance        Extremity/Trunk Assessment   Upper Extremity Assessment Upper Extremity Assessment: Overall  WFL for tasks assessed    Lower Extremity Assessment Lower Extremity Assessment: Generalized weakness;RLE deficits/detail RLE: Unable to fully assess due to pain RLE Sensation: WNL RLE Coordination: WNL       Communication   Communication: No difficulties  Cognition Arousal/Alertness: Awake/alert Behavior During Therapy: WFL for tasks assessed/performed Overall Cognitive Status: Within Functional Limits for tasks assessed                                        General Comments      Exercises Total Joint Exercises Ankle Circles/Pumps: AROM;Strengthening;Both;10 reps Quad Sets: Strengthening;Both;10 reps Gluteal Sets: Strengthening;Both;10 reps Long Arc Quad: AROM;Strengthening;Both;10 reps;15 reps Knee Flexion: AROM;Strengthening;Both;10 reps;15 reps Marching in Standing: AROM;Strengthening;Both;10 reps;Standing Other Exercises Other Exercises: Anterior hip precaution education Other Exercises: Transfer training to/from various height surfaces with cues for sequencing Other Exercises: Gait training with cues for proper sequencing Other Exercises: HEP education per handout   Assessment/Plan    PT Assessment Patient needs continued PT services  PT Problem List Decreased strength;Decreased activity tolerance;Decreased balance;Decreased mobility;Decreased knowledge of use of DME;Decreased knowledge of precautions;Pain       PT Treatment Interventions DME instruction;Gait training;Stair training;Functional mobility training;Therapeutic activities;Therapeutic exercise;Balance training;Patient/family education    PT Goals (Current goals can be found in the Care Plan section)  Acute Rehab PT Goals Patient Stated Goal: To walk better PT Goal Formulation: With patient Time For Goal Achievement: 11/29/19 Potential to Achieve Goals: Good    Frequency BID   Barriers to discharge        Co-evaluation               AM-PAC PT "6 Clicks" Mobility   Outcome Measure Help needed turning from your back to your side while in a flat bed without using bedrails?: A Little Help needed moving from lying on your back to sitting on the side of a flat bed without using bedrails?: A Little Help needed moving to and from a bed to a chair (including a wheelchair)?: A Little Help needed standing up from a chair using your arms (e.g., wheelchair or bedside chair)?: A Little Help needed to walk in hospital room?: A Little Help needed climbing 3-5 steps with a railing? : A Little 6 Click Score: 18    End of Session Equipment Utilized During Treatment: Gait belt Activity Tolerance: Patient tolerated treatment well Patient left: in chair;with call bell/phone within reach;with chair alarm set;with SCD's reapplied;Other (comment) (Ice bag to R hip) Nurse Communication: Mobility status;Weight bearing status PT Visit Diagnosis: Muscle weakness (generalized) (M62.81);Other abnormalities of gait and mobility (R26.89);Pain Pain - Right/Left: Right Pain - part of body: Hip    Time: 0912-1001 PT Time Calculation (min) (ACUTE ONLY): 49 min   Charges:   PT Evaluation $PT Eval Moderate Complexity: 1 Mod PT Treatments $Gait Training: 8-22 mins $Therapeutic Exercise: 8-22 mins       D. Scott Shakelia Scrivner PT, DPT 11/16/19, 1:28 PM

## 2019-11-16 NOTE — Anesthesia Postprocedure Evaluation (Signed)
Anesthesia Post Note  Patient: Maurice Perez  Procedure(s) Performed: TOTAL HIP ARTHROPLASTY ANTERIOR APPROACH (Right Hip)  Patient location during evaluation: Nursing Unit Anesthesia Type: Spinal Level of consciousness: oriented and awake and alert Pain management: pain level controlled Vital Signs Assessment: post-procedure vital signs reviewed and stable Respiratory status: spontaneous breathing and respiratory function stable Cardiovascular status: blood pressure returned to baseline and stable Postop Assessment: no headache, no backache, no apparent nausea or vomiting and patient able to bend at knees Anesthetic complications: no   No complications documented.   Last Vitals:  Vitals:   11/16/19 0330 11/16/19 0744  BP: (!) 136/91 133/75  Pulse: 93 (!) 45  Resp: 18 18  Temp: 36.7 C 36.8 C  SpO2: 98% 99%    Last Pain:  Vitals:   11/16/19 0744  TempSrc: Oral  PainSc:                  Rosanne Gutting

## 2019-11-16 NOTE — TOC Progression Note (Addendum)
Transition of Care [facility]) - Progression Note    Patient Details  Name: Maurice Perez MRN: [id number] Date of Birth: 02-23-1971  [facility] CM/SW [place], RN Phone Number: 11/16/2019, 4:20 PM  Clinical Narrative:     Met with the patient to discuss DC plan and needs, He lives at home and has help with his girl friend, He needs a RW and a 3 in 1, I notified Zack with [facility] and it will be brought into the room, I [facility], [facility], [facility] and none of them are able to accept the patient due to staffing, they are pushed out until week after next, I notified the physician and the PA and asked for guidance in what they would like to do instead, The physician stated he will take care of it from his office on Monday, The patient does not have a PCP, he agreed to get the first available appointment with [facility] for a PCP, the unit secretary called to get appointment, the information will be put on the DC paperwork   Expected Discharge Plan: [place] Barriers to Discharge: No [facility] will accept this patient  Expected Discharge Plan and Services Expected Discharge Plan: [place]   Discharge Planning Services: CM Consult   Living arrangements for the past 2 months: Single Family Home Expected Discharge Date: 11/16/19               DME Arranged: Gilford Rile rolling, 3-N-1 DME Agency: [facility] Date DME Agency Contacted: 11/16/19 Time DME Agency Contacted: [phone] Representative spoke with at DME Agency: zack             Social Determinants of Health ([place]) Interventions    Readmission Risk Interventions No flowsheet data found.

## 2019-11-16 NOTE — Discharge Summary (Signed)
Physician Discharge Summary  Patient ID: Maurice Perez MRN: [id number] DOB/AGE: [birth year] 49 y.o.  Admit date: 11/15/2019 Discharge date: 11/16/2019  Admission Diagnoses:  M16.11 Unilateral primary osteoarthritis, right hip <principal problem not specified>  Discharge Diagnoses:  M16.11 Unilateral primary osteoarthritis, right hip Active Problems:   Status post total hip replacement, right   Past Medical History:  Diagnosis Date  . Arthritis   . Bradycardia   . GERD (gastroesophageal reflux disease)   . Glaucoma   . Headache    H/O MIGRAINES    Surgeries: Procedure(s): TOTAL HIP ARTHROPLASTY ANTERIOR APPROACH on 11/15/2019   Consultants (if any):   Discharged Condition: Improved  Hospital Course: KERIC ZEHREN is an 49 y.o. male who was admitted 11/15/2019 with a diagnosis of  M16.11 Unilateral primary osteoarthritis, right hip <principal problem not specified> and went to the operating room on 11/15/2019 and underwent the above named procedures.    He was given perioperative antibiotics:  Anti-infectives (From admission, onward)   Start     Dose/Rate Route Frequency Ordered Stop   11/16/19 0130  ceFAZolin (ANCEF) IVPB 2g/100 mL premix        2 g 200 mL/hr over 30 Minutes Intravenous Every 6 hours 11/15/19 1947 11/16/19 0133   11/15/19 1930  ceFAZolin (ANCEF) IVPB 2g/100 mL premix  Status:  Discontinued        2 g 200 mL/hr over 30 Minutes Intravenous Every 6 hours 11/15/19 1928 11/15/19 1946   11/15/19 1357  50,000 units bacitracin in 0.9% normal saline 250 mL irrigation  Status:  Discontinued          As needed 11/15/19 1357 11/15/19 1508   11/15/19 1115  ceFAZolin (ANCEF) IVPB 2g/100 mL premix        2 g 200 mL/hr over 30 Minutes Intravenous On call to O.R. 11/15/19 1113 11/15/19 1327    .  He was given sequential compression devices, early ambulation, and aspirin for DVT prophylaxis.  He benefited maximally from the hospital stay and there were no  complications.    Recent vital signs:  Vitals:   11/16/19 0007 11/16/19 0330  BP: 137/81 (!) 136/91  Pulse: 77 93  Resp: 18 18  Temp: 97.6 F (36.4 C) 98 F (36.7 C)  SpO2: 98% 98%    Recent laboratory studies:  Lab Results  Component Value Date   HGB 14.4 11/10/2019   HGB 13.0 03/05/2018   HGB 14.2 08/02/2014   Lab Results  Component Value Date   WBC 3.2 (L) 11/10/2019   PLT 208 11/10/2019   Lab Results  Component Value Date   INR 1.0 11/10/2019   Lab Results  Component Value Date   NA 139 11/10/2019   K 3.9 11/10/2019   CL 104 11/10/2019   CO2 27 11/10/2019   BUN 14 11/10/2019   CREATININE 0.96 11/10/2019   GLUCOSE 96 11/10/2019    Discharge Medications:   Allergies as of 11/16/2019      Reactions   Singulair [montelukast Sodium] Hives      Medication List    STOP taking these medications   Advil 200 MG tablet Generic drug: ibuprofen   ketorolac 10 MG tablet Commonly known as: TORADOL   meloxicam 15 MG tablet Commonly known as: MOBIC   predniSONE 10 MG (21) Tbpk tablet Commonly known as: STERAPRED UNI-PAK 21 TAB   predniSONE 10 MG tablet Commonly known as: DELTASONE     TAKE these medications   acetaminophen 500 MG tablet  Commonly known as: TYLENOL Take 1,000 mg by mouth every 6 (six) hours as needed.   aspirin 81 MG chewable tablet Chew 1 tablet (81 mg total) by mouth 2 (two) times daily.   cyclobenzaprine 5 MG tablet Commonly known as: FLEXERIL Take 1-2 tablets 3 times daily as needed   docusate sodium 100 MG capsule Commonly known as: COLACE Take 1 capsule (100 mg total) by mouth 2 (two) times daily.   dorzolamide-timolol 22.3-6.8 MG/ML ophthalmic solution Commonly known as: COSOPT Place 1 drop into both eyes in the morning and at bedtime.   gabapentin 300 MG capsule Commonly known as: Neurontin Take 1 capsule (300 mg total) by mouth 3 (three) times daily as needed (right leg pain).   HYDROcodone-acetaminophen 5-325 MG  tablet Commonly known as: NORCO/VICODIN Take 1 tablet by mouth every 4 (four) hours as needed for moderate pain (pain). What changed:   when to take this  reasons to take this   latanoprost 0.005 % ophthalmic solution Commonly known as: XALATAN Place 1 drop into both eyes at bedtime.   lidocaine 5 % Commonly known as: Lidoderm Place 1 patch onto the skin daily. Remove & Discard patch within 12 hours or as directed by MD   ondansetron 4 MG tablet Commonly known as: ZOFRAN Take 1 tablet (4 mg total) by mouth every 6 (six) hours as needed for nausea.   timolol 0.5 % ophthalmic solution Commonly known as: TIMOPTIC Place 1 drop into both eyes in the morning and at bedtime.            Durable Medical Equipment  (From admission, onward)         Start     Ordered   11/16/19 0647  For home use only DME 3 n 1  Once        11/16/19 0647   11/16/19 0647  For home use only DME Walker rolling  Once       Question Answer Comment  Walker: With 5 Inch Wheels   Patient needs a walker to treat with the following condition Osteoarthritis of right hip      11/16/19 0647          Diagnostic Studies: DG HIP OPERATIVE UNILAT W OR W/O PELVIS RIGHT  Result Date: 11/15/2019 CLINICAL DATA:  Elective surgery. EXAM: OPERATIVE RIGHT HIP (WITH PELVIS IF PERFORMED) TECHNIQUE: Fluoroscopic spot image(s) were submitted for interpretation post-operatively. COMPARISON:  None. FINDINGS: Four fluoroscopic spot views obtained in the operating room. Right hip arthroplasty. Total fluoroscopy time 6 seconds. IMPRESSION: Fluoroscopic spot views during right hip arthroplasty. Electronically Signed   By: Narda Rutherford M.D.   On: 11/15/2019 16:42    Disposition: Discharge disposition: 01-Home or Self Care       Follow up in 2 weeks for staple removal.  Please call to confirm appointment [phone]     Signed: Altamese Cabal ,PA-C 11/16/2019, 6:47 AM

## 2019-11-16 NOTE — Plan of Care (Signed)
  Problem: Education: Goal: Knowledge of General Education information will improve Description: Including pain rating scale, medication(s)/side effects and non-pharmacologic comfort measures Outcome: Completed/Met

## 2019-11-16 NOTE — Progress Notes (Signed)
  Subjective:  Patient reports pain as mild to moderate.    Objective:   VITALS:   Vitals:   11/15/19 2204 11/15/19 2322 11/16/19 0007 11/16/19 0330  BP: (!) 154/84 135/79 137/81 (!) 136/91  Pulse: 97 99 77 93  Resp: '18 18 18 18  '$ Temp: 98.2 F (36.8 C) 98.2 F (36.8 C) 97.6 F (36.4 C) 98 F (36.7 C)  TempSrc: Oral Oral Oral Oral  SpO2: 98% 99% 98% 98%  Weight:      Height:        PHYSICAL EXAM:  Neurologically intact ABD soft Neurovascular intact Sensation intact distally Intact pulses distally Dorsiflexion/Plantar flexion intact Incision: dressing C/D/I No cellulitis present Compartment soft  LABS  Results for orders placed or performed during the hospital encounter of 11/15/19 (from the past 24 hour(s))  ABO/Rh     Status: None   Collection Time: 11/15/19 11:15 AM  Result Value Ref Range   ABO/RH(D)      A POS Performed at [facility], [address] [place], [place] [address]     DG HIP OPERATIVE UNILAT W OR W/O PELVIS RIGHT  Result Date: 11/15/2019 CLINICAL DATA:  Elective surgery. EXAM: OPERATIVE RIGHT HIP (WITH PELVIS IF PERFORMED) TECHNIQUE: Fluoroscopic spot image(s) were submitted for interpretation post-operatively. COMPARISON:  None. FINDINGS: Four fluoroscopic spot views obtained in the operating room. Right hip arthroplasty. Total fluoroscopy time 6 seconds. IMPRESSION: Fluoroscopic spot views during right hip arthroplasty. Electronically Signed   By: Keith Rake M.D.   On: 11/15/2019 16:42    Assessment/Plan: 1 Day Post-Op   Active Problems:   Status post total hip replacement, right   Advance diet Up with therapy  WBAT LLE Discharge today if PT goals met   Carlynn Spry , PA-C 11/16/2019, 6:41 AM

## 2019-11-17 DIAGNOSIS — M1611 Unilateral primary osteoarthritis, right hip: Secondary | ICD-10-CM | POA: Diagnosis not present

## 2019-11-17 LAB — CBC
HCT: 36.5 % — ABNORMAL LOW (ref 39.0–52.0)
Hemoglobin: 12.7 g/dL — ABNORMAL LOW (ref 13.0–17.0)
MCH: 30.5 pg (ref 26.0–34.0)
MCHC: 34.8 g/dL (ref 30.0–36.0)
MCV: 87.5 fL (ref 80.0–100.0)
Platelets: 185 10*3/uL (ref 150–400)
RBC: 4.17 MIL/uL — ABNORMAL LOW (ref 4.22–5.81)
RDW: 14.6 % (ref 11.5–15.5)
WBC: 9.8 10*3/uL (ref 4.0–10.5)
nRBC: 0 % (ref 0.0–0.2)

## 2019-11-17 LAB — SURGICAL PATHOLOGY

## 2019-11-17 MED ORDER — OXYCODONE HCL 5 MG PO TABS
10.0000 mg | ORAL_TABLET | ORAL | Status: DC | PRN
  Administered 2019-11-17: 10 mg via ORAL
  Filled 2019-11-17: qty 2

## 2019-11-17 MED ORDER — OXYCODONE HCL 10 MG PO TABS: 10.0000 mg | ORAL_TABLET | ORAL | 0 refills | Status: DC | PRN

## 2019-11-17 NOTE — Progress Notes (Signed)
Physical Therapy Treatment Patient Details Name: Maurice Perez MRN: [id number] DOB: 1970-06-24 Today's Date: 11/17/2019    History of Present Illness Pt is a 49 yo male diagnosed with OA of the R hip and is s/p R THR.  PMH includes: bradycardia, glaucoma, and GERD.    PT Comments    Pt pleasant and motivated to participate during the session. Pt had increased pain during today's session which appeared to impact his ability to perform therex. Pt appeared to be in pain with bed exercises and bed mobility, and required mod cueing to scoot in bed and go from supine to sit. Pt was able to stand up without much difficulty and perform standing marches at EOB. Pt expressed being in more pain during ambulation today, and was only able to ambulate ~51ft before asking to sit down. Pt was reeducated with stair training, with minimal carryover from yesterday with max cueing for sequencing. Pt's fiance was present for this portion of the session and educated on how to help him ascend and descend stairs safely at home. Pt's fiance was also given education on how to help pt perform car transfers safely. Pt was repositioned at end of session with a bag of ice to help with pain. Pt will benefit from HHPT services upon discharge to safely address deficits listed in patient problem list for decreased caregiver assistance and eventual return to PLOF.    Follow Up Recommendations  Home health PT;Supervision - Intermittent     Equipment Recommendations       Recommendations for Other Services       Precautions / Restrictions Precautions Precautions: Fall;Anterior Hip Precaution Booklet Issued: Yes (comment) Restrictions Weight Bearing Restrictions: Yes RLE Weight Bearing: Weight bearing as tolerated    Mobility  Bed Mobility Overal bed mobility: Needs Assistance Bed Mobility: Supine to Sit     Supine to sit: Supervision;HOB elevated     General bed mobility comments: Patient was in more pain today  which appeared to impact bed mobility. Patient required mod cueing today to scoot in bed and get from supine-sit  Transfers Overall transfer level: Needs assistance Equipment used: Rolling walker (2 wheeled) Transfers: Sit to/from Stand Sit to Stand: Min guard         General transfer comment: Min verbal cues for hand positioning  Ambulation/Gait Ambulation/Gait assistance: Min guard Gait Distance (Feet): 15 Feet Assistive device: Rolling walker (2 wheeled) Gait Pattern/deviations: Step-to pattern;Decreased step length - left;Decreased stance time - right;Antalgic Gait velocity: decreased   General Gait Details: min verbal cues for amb closer to the RW   Stairs Stairs: Yes Stairs assistance: Min guard Stair Management: One rail Left;Forwards;Backwards Number of Stairs: 3 General stair comments: Pt able to ascend/descend 3 steps with one rail with max verbal cues for proper sequencing with poor carryover during training but good eccentric/concentric control   Wheelchair Mobility    Modified Rankin (Stroke Patients Only)       Balance Overall balance assessment: Needs assistance Sitting-balance support: Feet unsupported;No upper extremity supported Sitting balance-Leahy Scale: Good     Standing balance support: Bilateral upper extremity supported;During functional activity Standing balance-Leahy Scale: Good Standing balance comment: Min support from the RW during functional mobility                            Cognition Arousal/Alertness: Awake/alert Behavior During Therapy: WFL for tasks assessed/performed Overall Cognitive Status: Within Functional Limits for tasks assessed  General Comments: minimal carry-over and had a hard time following cues for stair training      Exercises Total Joint Exercises Ankle Circles/Pumps: AROM;Strengthening;Both;10 reps;15 reps Quad Sets: Strengthening;Both;10 reps;15  reps Gluteal Sets: Strengthening;Both;10 reps Hip ABduction/ADduction: AROM;Strengthening;Right;10 reps Straight Leg Raises: AROM;Strengthening;Right;10 reps Long Arc Quad: AROM;Strengthening;Both;10 reps;15 reps Marching in Standing: AROM;Strengthening;Both;10 reps;Standing Other Exercises Other Exercises: Gait training with cues for proper sequencing Other Exercises: Verbal education on how to complete transfers in/out of car w/ visual simulation using room chair (fiance present)    General Comments        Pertinent Vitals/Pain Pain Assessment: Faces Faces Pain Scale: Hurts whole lot Pain Location: R hip Pain Descriptors / Indicators: Grimacing;Guarding Pain Intervention(s): Premedicated before session;Repositioned;Monitored during session;Ice applied    Home Living                      Prior Function            PT Goals (current goals can now be found in the care plan section) Progress towards PT goals: Progressing toward goals    Frequency    BID      PT Plan Current plan remains appropriate    Co-evaluation              AM-PAC PT "6 Clicks" Mobility   Outcome Measure  Help needed turning from your back to your side while in a flat bed without using bedrails?: A Little Help needed moving from lying on your back to sitting on the side of a flat bed without using bedrails?: A Little Help needed moving to and from a bed to a chair (including a wheelchair)?: A Little Help needed standing up from a chair using your arms (e.g., wheelchair or bedside chair)?: A Little Help needed to walk in hospital room?: A Little Help needed climbing 3-5 steps with a railing? : A Little 6 Click Score: 18    End of Session Equipment Utilized During Treatment: Gait belt Activity Tolerance: Patient limited by pain Patient left: in chair;with call bell/phone within reach;with chair alarm set;with SCD's reapplied;Other (comment);with family/visitor present Nurse  Communication: Mobility status;Weight bearing status PT Visit Diagnosis: Muscle weakness (generalized) (M62.81);Other abnormalities of gait and mobility (R26.89);Pain Pain - Right/Left: Right Pain - part of body: Hip     Time: 2694-8546 PT Time Calculation (min) (ACUTE ONLY): 52 min  Charges:  $Gait Training: 8-22 mins $Therapeutic Exercise: 8-22 mins $Therapeutic Activity: 8-22 mins                     D. Scott Cordale Manera PT, DPT 11/17/19, 1:34 PM

## 2019-11-17 NOTE — Discharge Summary (Signed)
Physician Discharge Summary  Patient ID: Maurice Perez MRN: [id number] DOB/AGE: 1970/10/18 49 y.o.  Admit date: 11/15/2019 Discharge date: 11/17/2019  Admission Diagnoses:  M16.11 Unilateral primary osteoarthritis, right hip <principal problem not specified>  Discharge Diagnoses:  M16.11 Unilateral primary osteoarthritis, right hip Active Problems:   Status post total hip replacement, right   Past Medical History:  Diagnosis Date  . Arthritis   . Bradycardia   . GERD (gastroesophageal reflux disease)   . Glaucoma   . Headache    H/O MIGRAINES    Surgeries: Procedure(s): TOTAL HIP ARTHROPLASTY ANTERIOR APPROACH on 11/15/2019   Consultants (if any):   Discharged Condition: Improved  Hospital Course: Maurice Perez is an 49 y.o. male who was admitted 11/15/2019 with a diagnosis of  M16.11 Unilateral primary osteoarthritis, right hip <principal problem not specified> and went to the operating room on 11/15/2019 and underwent the above named procedures.    He was given perioperative antibiotics:  Anti-infectives (From admission, onward)   Start     Dose/Rate Route Frequency Ordered Stop   11/16/19 0130  ceFAZolin (ANCEF) IVPB 2g/100 mL premix        2 g 200 mL/hr over 30 Minutes Intravenous Every 6 hours 11/15/19 1947 11/16/19 0133   11/15/19 1930  ceFAZolin (ANCEF) IVPB 2g/100 mL premix  Status:  Discontinued        2 g 200 mL/hr over 30 Minutes Intravenous Every 6 hours 11/15/19 1928 11/15/19 1946   11/15/19 1357  50,000 units bacitracin in 0.9% normal saline 250 mL irrigation  Status:  Discontinued          As needed 11/15/19 1357 11/15/19 1508   11/15/19 1115  ceFAZolin (ANCEF) IVPB 2g/100 mL premix        2 g 200 mL/hr over 30 Minutes Intravenous On call to O.R. 11/15/19 1113 11/15/19 1327    .  He was given sequential compression devices, early ambulation, and aspirin for DVT prophylaxis.  He benefited maximally from the hospital stay and there were no  complications.    Recent vital signs:  Vitals:   11/17/19 0022 11/17/19 0739  BP: 137/78 (!) 146/99  Pulse: (!) 53 (!) 47  Resp: 18 17  Temp: 99 F (37.2 C) 97.9 F (36.6 C)  SpO2: 100% 97%    Recent laboratory studies:  Lab Results  Component Value Date   HGB 12.7 (L) 11/17/2019   HGB 14.1 11/16/2019   HGB 14.4 11/10/2019   Lab Results  Component Value Date   WBC 9.8 11/17/2019   PLT 185 11/17/2019   Lab Results  Component Value Date   INR 1.0 11/10/2019   Lab Results  Component Value Date   NA 136 11/16/2019   K 4.1 11/16/2019   CL 104 11/16/2019   CO2 22 11/16/2019   BUN 12 11/16/2019   CREATININE 0.86 11/16/2019   GLUCOSE 118 (H) 11/16/2019    Discharge Medications:   Allergies as of 11/17/2019      Reactions   Singulair [montelukast Sodium] Hives      Medication List    STOP taking these medications   Advil 200 MG tablet Generic drug: ibuprofen   HYDROcodone-acetaminophen 5-325 MG tablet Commonly known as: NORCO/VICODIN   ketorolac 10 MG tablet Commonly known as: TORADOL   meloxicam 15 MG tablet Commonly known as: MOBIC   predniSONE 10 MG (21) Tbpk tablet Commonly known as: STERAPRED UNI-PAK 21 TAB   predniSONE 10 MG tablet Commonly known as: DELTASONE  TAKE these medications   acetaminophen 500 MG tablet Commonly known as: TYLENOL Take 1,000 mg by mouth every 6 (six) hours as needed.   aspirin 81 MG chewable tablet Chew 1 tablet (81 mg total) by mouth 2 (two) times daily.   cyclobenzaprine 5 MG tablet Commonly known as: FLEXERIL Take 1-2 tablets 3 times daily as needed   docusate sodium 100 MG capsule Commonly known as: COLACE Take 1 capsule (100 mg total) by mouth 2 (two) times daily.   dorzolamide-timolol 22.3-6.8 MG/ML ophthalmic solution Commonly known as: COSOPT Place 1 drop into both eyes in the morning and at bedtime.   gabapentin 300 MG capsule Commonly known as: Neurontin Take 1 capsule (300 mg total) by mouth  3 (three) times daily as needed (right leg pain).   latanoprost 0.005 % ophthalmic solution Commonly known as: XALATAN Place 1 drop into both eyes at bedtime.   lidocaine 5 % Commonly known as: Lidoderm Place 1 patch onto the skin daily. Remove & Discard patch within 12 hours or as directed by MD   ondansetron 4 MG tablet Commonly known as: ZOFRAN Take 1 tablet (4 mg total) by mouth every 6 (six) hours as needed for nausea.   Oxycodone HCl 10 MG Tabs Take 1 tablet (10 mg total) by mouth every 4 (four) hours as needed for severe pain.   timolol 0.5 % ophthalmic solution Commonly known as: TIMOPTIC Place 1 drop into both eyes in the morning and at bedtime.            Durable Medical Equipment  (From admission, onward)         Start     Ordered   11/16/19 0647  For home use only DME 3 n 1  Once        11/16/19 0647   11/16/19 0647  For home use only DME Walker rolling  Once       Question Answer Comment  Walker: With 5 Inch Wheels   Patient needs a walker to treat with the following condition Osteoarthritis of right hip      11/16/19 0647          Diagnostic Studies: DG HIP OPERATIVE UNILAT W OR W/O PELVIS RIGHT  Result Date: 11/15/2019 CLINICAL DATA:  Elective surgery. EXAM: OPERATIVE RIGHT HIP (WITH PELVIS IF PERFORMED) TECHNIQUE: Fluoroscopic spot image(s) were submitted for interpretation post-operatively. COMPARISON:  None. FINDINGS: Four fluoroscopic spot views obtained in the operating room. Right hip arthroplasty. Total fluoroscopy time 6 seconds. IMPRESSION: Fluoroscopic spot views during right hip arthroplasty. Electronically Signed   By: Narda Rutherford M.D.   On: 11/15/2019 16:42    Disposition: Discharge disposition: 01-Home or Self Care          Follow-up Information    [facility]   Why: Office  [facility])  will call back w/ Appt; New Patient Appt; First Avail Request.Internal Medicine;  Contact information: [address]  [place] [address] [phone]                Signed: Lyndle Herrlich ,MD 11/17/2019, 10:47 AM

## 2019-11-17 NOTE — TOC Transition Note (Addendum)
Transition of Care [facility]) - CM/SW Discharge Note   Patient Details  Name: Maurice Perez MRN: [id number] Date of Birth: June 14, 1970  Transition of Care [facility]) CM/SW Contact:  Barrie Dunker, RN Phone Number: 11/17/2019, 8:32 AM   Clinical Narrative:   The patient provided with a 3 in 1 and a RW in the room from [employer], Dr Odis Luster will arrange PT on Monday from the office, the patient is aware, PCP is being set up with [facility] clinic first available by the Unit secretary, He has transportation with his girlfriend    Final next level of care: Home w Home Health Services Barriers to Discharge: No Home Care Agency will accept this patient   Patient Goals and CMS Choice Patient states their goals for this hospitalization and ongoing recovery are:: go home      Discharge Placement                       Discharge Plan and Services   Discharge Planning Services: CM Consult            DME Arranged: Dan Humphreys rolling, 3-N-1 DME Agency: [employer] Date DME Agency Contacted: 11/16/19 Time DME Agency Contacted: [phone] Representative spoke with at DME Agency: [email or web address]            Social Determinants of Health (SDOH) Interventions     Readmission Risk Interventions No flowsheet data found.

## 2019-11-17 NOTE — Progress Notes (Signed)
Patient in a lot of pain. Wanted to get up to chair to see if that would help any. No relief. Pain meds given. Patient back in bed still complaining of sharp pain to the R leg.

## 2019-11-17 NOTE — Progress Notes (Signed)
  Subjective:  Patient reports pain as moderate.  Patient doing well overall.   Objective:   VITALS:   Vitals:   11/16/19 1547 11/16/19 1548 11/17/19 0022 11/17/19 0739  BP: (!) 147/85  137/78 (!) 146/99  Pulse: 87 60 (!) 53 (!) 47  Resp:  '18 18 17  '$ Temp: 99.6 F (37.6 C)  99 F (37.2 C) 97.9 F (36.6 C)  TempSrc: Oral  Oral Oral  SpO2: 100% 99% 100% 97%  Weight:      Height:        PHYSICAL EXAM:  Neurovascular intact Sensation intact distally Intact pulses distally Dorsiflexion/Plantar flexion intact Incision: no drainage No cellulitis present Compartment soft  LABS  Results for orders placed or performed during the hospital encounter of 11/15/19 (from the past 24 hour(s))  CBC     Status: Abnormal   Collection Time: 11/17/19  6:25 AM  Result Value Ref Range   WBC 9.8 4.0 - 10.5 K/uL   RBC 4.17 (L) 4.22 - 5.81 MIL/uL   Hemoglobin 12.7 (L) 13.0 - 17.0 g/dL   HCT 36.5 (L) 39 - 52 %   MCV 87.5 80.0 - 100.0 fL   MCH 30.5 26.0 - 34.0 pg   MCHC 34.8 30.0 - 36.0 g/dL   RDW 14.6 11.5 - 15.5 %   Platelets 185 150 - 400 K/uL   nRBC 0.0 0.0 - 0.2 %    DG HIP OPERATIVE UNILAT W OR W/O PELVIS RIGHT  Result Date: 11/15/2019 CLINICAL DATA:  Elective surgery. EXAM: OPERATIVE RIGHT HIP (WITH PELVIS IF PERFORMED) TECHNIQUE: Fluoroscopic spot image(s) were submitted for interpretation post-operatively. COMPARISON:  None. FINDINGS: Four fluoroscopic spot views obtained in the operating room. Right hip arthroplasty. Total fluoroscopy time 6 seconds. IMPRESSION: Fluoroscopic spot views during right hip arthroplasty. Electronically Signed   By: Keith Rake M.D.   On: 11/15/2019 16:42    Assessment/Plan: 2 Days Post-Op   Active Problems:   Status post total hip replacement, right   Advance diet Up with therapy Discharge today if PT goals met  Sandie Ano , PA 11/17/2019, 11:47 AM

## 2019-11-17 NOTE — Progress Notes (Signed)
Patient complained of pain of 8. Tried to administer Norco but he had already received max dose of tylenol. Only other Prn pain med he had was morphine. Called the on call MD around 2249. Waiting call back.

## 2019-11-17 NOTE — TOC Progression Note (Signed)
Transition of Care [facility]) - Progression Note    Patient Details  Name: DERAK SCHURMAN MRN: [id number] Date of Birth: [birth year]  Transition of Care [facility]) CM/SW Contact  Barrie Dunker, RN Phone Number: 11/17/2019, 12:17 PM  Clinical Narrative:   Patient does not have transportation to get home, Attempted to call Fiance to see if she would be available to help get out of the car,  I called [facility] to arrange assisted transport home,  Requested the driver to call once arrive to pick up the patient to the unit, will ask for charge nurse They will email waiver to be signed by the patient  Received the waiver and took to the patient to sign and emailed to [email or web address]   Expected Discharge Plan: Home w Home Health Services Barriers to Discharge: No Home Care Agency will accept this patient  Expected Discharge Plan and Services Expected Discharge Plan: Home w Home Health Services   Discharge Planning Services: CM Consult   Living arrangements for the past 2 months: Single Family Home Expected Discharge Date: 11/17/19               DME Arranged: Dan Humphreys rolling, 3-N-1 DME Agency: [employer] Date DME Agency Contacted: 11/16/19 Time DME Agency Contacted: [phone] Representative spoke with at DME Agency: [email or web address]             Social Determinants of Health (SDOH) Interventions    Readmission Risk Interventions No flowsheet data found.

## 2020-02-25 ENCOUNTER — Emergency Department: Payer: BLUE CROSS/BLUE SHIELD

## 2020-02-25 ENCOUNTER — Emergency Department
Admission: EM | Admit: 2020-02-25 | Discharge: 2020-02-25 | Disposition: A | Discharge status: Home / Self Care | Payer: BLUE CROSS/BLUE SHIELD | Attending: Emergency Medicine | Admitting: Emergency Medicine

## 2020-02-25 ENCOUNTER — Other Ambulatory Visit: Payer: Self-pay

## 2020-02-25 DIAGNOSIS — Z87891 Personal history of nicotine dependence: Secondary | ICD-10-CM | POA: Diagnosis not present

## 2020-02-25 DIAGNOSIS — Z7982 Long term (current) use of aspirin: Secondary | ICD-10-CM | POA: Insufficient documentation

## 2020-02-25 DIAGNOSIS — M25511 Pain in right shoulder: Secondary | ICD-10-CM | POA: Diagnosis not present

## 2020-02-25 DIAGNOSIS — Z96641 Presence of right artificial hip joint: Secondary | ICD-10-CM | POA: Insufficient documentation

## 2020-02-25 MED ORDER — CYCLOBENZAPRINE HCL 10 MG PO TABS: 10.0000 mg | ORAL_TABLET | Freq: Three times a day (TID) | ORAL | 0 refills | Status: DC | PRN

## 2020-02-25 MED ORDER — MELOXICAM 15 MG PO TABS
15.0000 mg | ORAL_TABLET | Freq: Every day | ORAL | 2 refills | Status: AC
Start: 2020-02-25 — End: 2021-02-24

## 2020-02-25 NOTE — ED Provider Notes (Signed)
[facility] Emergency Department Provider Note  ____________________________________________   First MD Initiated Contact with Patient 02/25/20 1632     (approximate)  I have reviewed the triage vital signs and the nursing notes.   HISTORY  Chief Complaint Shoulder Pain    HPI Maurice Perez is a 49 y.o. male presents emergency department complaint of right shoulder pain.  Symptoms for 1 week.  States pain is with movement.  Patient states he has a history of bursitis in the shoulder.  No recent injury.  No numbness or tingling.  No chest pain or shortness of breath    Past Medical History:  Diagnosis Date  . Arthritis   . Bradycardia   . GERD (gastroesophageal reflux disease)   . Glaucoma   . Headache    H/O MIGRAINES    Patient Active Problem List   Diagnosis Date Noted  . 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 EYE SURGERY     AS A CHILD  . FRACTURE SURGERY Left    LLE  . TOTAL HIP ARTHROPLASTY Right 11/15/2019   Procedure: TOTAL HIP ARTHROPLASTY ANTERIOR APPROACH;  Surgeon: Lyndle Herrlich, MD;  Location: [facility] ORS;  Service: Orthopedics;  Laterality: Right;    Prior to Admission medications   Medication Sig Start Date End Date Taking? Authorizing Provider  acetaminophen (TYLENOL) 500 MG tablet Take 1,000 mg by mouth every 6 (six) hours as needed.    [provider]  aspirin 81 MG chewable tablet Chew 1 tablet (81 mg total) by mouth 2 (two) times daily. 11/16/19   Altamese Cabal, PA-C  cyclobenzaprine (FLEXERIL) 10 MG tablet Take 1 tablet (10 mg total) by mouth 3 (three) times daily as needed. 02/25/20   Trentan Trippe, Roselyn Bering, PA-C  docusate sodium (COLACE) 100 MG capsule Take 1 capsule (100 mg total) by mouth 2 (two) times daily. 11/16/19   Altamese Cabal, PA-C  dorzolamide-timolol (COSOPT) 22.3-6.8 MG/ML ophthalmic solution Place 1 drop into both eyes in the morning  and at bedtime. 05/08/19   [provider]  latanoprost (XALATAN) 0.005 % ophthalmic solution Place 1 drop into both eyes at bedtime. 07/31/19   [provider]  meloxicam (MOBIC) 15 MG tablet Take 1 tablet (15 mg total) by mouth daily. 02/25/20 02/24/21  Tywaun Hiltner, Roselyn Bering, PA-C  timolol (TIMOPTIC) 0.5 % ophthalmic solution Place 1 drop into both eyes in the morning and at bedtime. 05/03/19   [provider]  gabapentin (NEURONTIN) 300 MG capsule Take 1 capsule (300 mg total) by mouth 3 (three) times daily as needed (right leg pain). Patient not taking: Reported on 10/24/2019 02/27/19 02/25/20  Phineas Semen, MD    Allergies Singulair [montelukast sodium]  No family history on file.  Social History Social History   Tobacco Use  . Smoking status: Former Smoker    Packs/day: 2.00    Years: 6.00    Pack years: 12.00    Types: Cigarettes    Quit date: 2016    Years since quitting: 5.7  . Smokeless tobacco: Never Used  Vaping Use  . Vaping Use: Never used  Substance Use Topics  . Alcohol use: Yes    Comment: 12 PACK BEER DAILY  . Drug use: No    Review of Systems  Constitutional: No fever/chills Eyes: No visual changes. ENT: No sore throat. Respiratory: Denies cough Cardiovascular: Denies chest pain Gastrointestinal: Denies abdominal pain Genitourinary: Negative  for dysuria. Musculoskeletal:  Positive for right shoulder and upper back pain Skin: Negative for rash. Psychiatric: no mood changes,     ____________________________________________   PHYSICAL EXAM:  VITAL SIGNS: ED Triage Vitals  Enc Vitals Group     BP 02/25/20 1554 140/86     Pulse Rate 02/25/20 1552 (!) 108     Resp 02/25/20 1552 18     Temp 02/25/20 1552 98.6 F (37 C)     Temp Source 02/25/20 1552 Oral     SpO2 02/25/20 1552 97 %     Weight 02/25/20 1552 180 lb (81.6 kg)     Height 02/25/20 1552 5\' 5"  (1.651 m)     Head Circumference --      Peak Flow --      Pain  Score 02/25/20 1552 10     Pain Loc --      Pain Edu? --      Excl. in GC? --     Constitutional: Alert and oriented. Well appearing and in no acute distress. Eyes: Conjunctivae are normal.  Head: Atraumatic. Nose: No congestion/rhinnorhea. Mouth/Throat: Mucous membranes are moist.   Neck:  supple no lymphadenopathy noted Cardiovascular: Normal rate, regular rhythm.  Respiratory: Normal respiratory effort.  No retractions,  GU: deferred Musculoskeletal: FROM all extremities, warm and well perfused, patient is able to take his jacket off without difficulty, right shoulder is tender at the joint space and along the supraspinatus and bursa of the scapula, grips are equal bilaterally, neurovascular intact Neurologic:  Normal speech and language.  Skin:  Skin is warm, dry and intact. No rash noted. Psychiatric: Mood and affect are normal. Speech and behavior are normal.  ____________________________________________   LABS (all labs ordered are listed, but only abnormal results are displayed)  Labs Reviewed - No data to display ____________________________________________   ____________________________________________  RADIOLOGY  X-ray of the right shoulder is negative  ____________________________________________   PROCEDURES  Procedure(s) performed: No  Procedures    ____________________________________________   INITIAL IMPRESSION / ASSESSMENT AND PLAN / ED COURSE  Pertinent labs & imaging results that were available during my care of the patient were reviewed by me and considered in my medical decision making (see chart for details).   Patient is a 49 year old male presents emergency department right shoulder pain.  See HPI.  Physical exam shows it to be a musculoskeletal problem.  Pain is reproduced with movement.  X-ray of the right shoulder is negative  I described the findings to the patient.  He was given a prescription for meloxicam and Flexeril.   Follow-up with his regular doctor or orthopedics if not improving in 5 to 7 days.  Return emergency department worsening.  Apply ice.  States he understands.  Is discharged stable condition.     Maurice Perez was evaluated in Emergency Department on 02/25/2020 for the symptoms described in the history of present illness. He was evaluated in the context of the global COVID-19 pandemic, which necessitated consideration that the patient might be at risk for infection with the SARS-CoV-2 virus that causes COVID-19. Institutional protocols and algorithms that pertain to the evaluation of patients at risk for COVID-19 are in a state of rapid change based on information released by regulatory bodies including the CDC and federal and state organizations. These policies and algorithms were followed during the patient's care in the ED.    As part of my medical decision making, I reviewed the following data within the electronic MEDICAL RECORD NUMBER  Nursing notes reviewed and incorporated, Old chart reviewed, Radiograph reviewed , Notes from prior ED visits and [place] Controlled Substance Database  ____________________________________________   FINAL CLINICAL IMPRESSION(S) / ED DIAGNOSES  Final diagnoses:  Acute pain of right shoulder      NEW MEDICATIONS STARTED DURING THIS VISIT:  New Prescriptions   CYCLOBENZAPRINE (FLEXERIL) 10 MG TABLET    Take 1 tablet (10 mg total) by mouth 3 (three) times daily as needed.   MELOXICAM (MOBIC) 15 MG TABLET    Take 1 tablet (15 mg total) by mouth daily.     Note:  This document was prepared using Dragon voice recognition software and may include unintentional dictation errors.    Faythe Ghee, PA-C 02/25/20 1724    Dionne Bucy, MD 02/25/20 2352

## 2020-02-25 NOTE — ED Triage Notes (Signed)
Pt to ED POV c/o right shoulder pain x1 week. Denies injury.  Ambulatory, NAD noted. Clear speech, alert and oriented.

## 2020-02-25 NOTE — Discharge Instructions (Signed)
Apply ice to shoulder.  Take medication as prescribed.  Return if worsening.  Follow-up with orthopedics if not improving in 5 to 7 days.  Please call them for an appointment

## 2020-03-04 ENCOUNTER — Emergency Department
Admission: EM | Admit: 2020-03-04 | Discharge: 2020-03-04 | Disposition: A | Discharge status: Home / Self Care | Payer: BLUE CROSS/BLUE SHIELD | Attending: Emergency Medicine | Admitting: Emergency Medicine

## 2020-03-04 ENCOUNTER — Encounter: Payer: Self-pay | Admitting: Emergency Medicine

## 2020-03-04 ENCOUNTER — Other Ambulatory Visit: Payer: Self-pay

## 2020-03-04 DIAGNOSIS — Z87891 Personal history of nicotine dependence: Secondary | ICD-10-CM | POA: Insufficient documentation

## 2020-03-04 DIAGNOSIS — M25511 Pain in right shoulder: Secondary | ICD-10-CM | POA: Insufficient documentation

## 2020-03-04 MED ORDER — KETOROLAC TROMETHAMINE 30 MG/ML IJ SOLN
30.0000 mg | Freq: Once | INTRAMUSCULAR | Status: AC
  Administered 2020-03-04: 30 mg via INTRAVENOUS
  Filled 2020-03-04: qty 1

## 2020-03-04 MED ORDER — PREDNISONE 20 MG PO TABS
60.0000 mg | ORAL_TABLET | Freq: Once | ORAL | Status: AC
  Administered 2020-03-04: 60 mg via ORAL
  Filled 2020-03-04: qty 3

## 2020-03-04 NOTE — ED Provider Notes (Signed)
Emergency Department Provider Note  ____________________________________________  Time seen: Approximately 5:45 PM  I have reviewed the triage vital signs and the nursing notes.   HISTORY  Chief Complaint Shoulder Pain   Historian Patient     HPI Maurice Perez is a 49 y.o. male presents to the emergency department with persistent right shoulder pain.  Patient was seen and evaluated in this emergency department for similar complaints on 10/17.  Patient localizes pain to the superior aspect of the right shoulder and along the lateral aspect of the right shoulder.  He states that pain is been keeping up at night and he feels like he has restricted range of motion.  He states he was told to follow-up with orthopedics but has not had time due to his work schedule.  He states that he is hoping to get "a shot of something" in the emergency department today to buy him some time.  He would also like a sling.  He denies chest pain, chest tightness or abdominal pain.  No fever or chills.  No new falls or mechanisms of trauma.   Past Medical History:  Diagnosis Date   Arthritis    Bradycardia    GERD (gastroesophageal reflux disease)    Glaucoma    Headache    H/O MIGRAINES     Immunizations up to date:  Yes.     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  Authorizing Provider  acetaminophen (TYLENOL) 500 MG tablet Take 1,000 mg by mouth every 6 (six) hours as needed.    [provider]  aspirin 81 MG chewable tablet Chew 1 tablet (81 mg total) by mouth 2 (two) times daily. 11/16/19   Altamese Cabal, PA-C  cyclobenzaprine (FLEXERIL) 10 MG tablet Take 1 tablet (10 mg total) by mouth 3 (three) times daily as needed. 02/25/20   Fisher, Roselyn Bering, PA-C  docusate sodium (COLACE) 100 MG capsule Take 1 capsule (100 mg total) by mouth 2 (two) times daily. 11/16/19   Altamese Cabal, PA-C  dorzolamide-timolol (COSOPT) 22.3-6.8 MG/ML ophthalmic solution Place 1 drop into both eyes in the morning and at bedtime. 05/08/19   [provider]  latanoprost (XALATAN) 0.005 % ophthalmic solution Place 1 drop into both eyes at bedtime. 07/31/19   [provider]  meloxicam (MOBIC) 15 MG tablet Take 1 tablet (15 mg total) by mouth daily. 02/25/20 02/24/21  Fisher, Roselyn Bering, PA-C  timolol (TIMOPTIC) 0.5 % ophthalmic solution Place 1 drop into both eyes in the morning and at bedtime. 05/03/19   [provider]  gabapentin (NEURONTIN) 300 MG capsule Take 1 capsule (300 mg total) by mouth 3 (three) times daily as needed (right leg pain). Patient not taking: Reported on 10/24/2019 02/27/19 02/25/20  Phineas Semen, MD  Allergies Singulair [montelukast sodium]  History reviewed. No pertinent family history.  Social History Social History   Tobacco Use   Smoking status: Former Smoker    Packs/day: 2.00    Years: 6.00    Pack years: 12.00    Types: Cigarettes    Quit date: 2016    Years since quitting: 5.8   Smokeless tobacco: Never Used  Building services engineer Use: Never used  Substance Use Topics   Alcohol use: Yes    Comment: 12 PACK BEER DAILY   Drug use: No     Review of Systems  Constitutional: No fever/chills Eyes:  No discharge ENT: No upper respiratory complaints. Respiratory: no cough. No SOB/ use of accessory  muscles to breath Gastrointestinal:   No nausea, no vomiting.  No diarrhea.  No constipation. Musculoskeletal: Patient has right shoulder pain.  Skin: Negative for rash, abrasions, lacerations, ecchymosis.    ____________________________________________   PHYSICAL EXAM:  VITAL SIGNS: ED Triage Vitals [03/04/20 1637]  Enc Vitals Group     BP (!) 156/105     Pulse Rate 98     Resp 20     Temp 98.6 F (37 C)     Temp Source Oral     SpO2 99 %     Weight 180 lb (81.6 kg)     Height 5\' 5"  (1.651 m)     Head Circumference      Peak Flow      Pain Score 10     Pain Loc      Pain Edu?      Excl. in GC?      Constitutional: Alert and oriented. Well appearing and in no acute distress. Eyes: Conjunctivae are normal. PERRL. EOMI. Head: Atraumatic. Cardiovascular: Normal rate, regular rhythm. Normal S1 and S2.  Good peripheral circulation. Respiratory: Normal respiratory effort without tachypnea or retractions. Lungs CTAB. Good air entry to the bases with no decreased or absent breath sounds Gastrointestinal: Bowel sounds x 4 quadrants. Soft and nontender to palpation. No guarding or rigidity. No distention. Musculoskeletal: Patient has reduced range of motion at the right shoulder.  Palpable radial pulse, right.  Capillary refill less than 2 seconds on the right. Neurologic:  Normal for age. No gross focal neurologic deficits are appreciated.  Skin:  Skin is warm, dry and intact. No rash noted. Psychiatric: Mood and affect are normal for age. Speech and behavior are normal.   ____________________________________________   LABS (all labs ordered are listed, but only abnormal results are displayed)  Labs Reviewed - No data to display ____________________________________________  EKG   ____________________________________________  RADIOLOGY   No results found.  ____________________________________________    PROCEDURES  Procedure(s) performed:      Procedures     Medications  ketorolac (TORADOL) 30 MG/ML injection 30 mg (30 mg Intravenous Given 03/04/20 1752)  predniSONE (DELTASONE) tablet 60 mg (60 mg Oral Given 03/04/20 1752)     ____________________________________________   INITIAL IMPRESSION / ASSESSMENT AND PLAN / ED COURSE  Pertinent labs & imaging results that were available during my care of the patient were reviewed by me and considered in my medical decision making (see chart for details).      Assessment and Plan: Right shoulder pain:  49 year old male presents to the emergency department with acute right shoulder pain.  Patient was hypertensive at triage but vital signs were otherwise reassuring.  Patient presented with limited range of motion.  I reviewed patient's x-rays from 02/25/2020  with no acute abnormalities noted.  Patient was placed in a sling and given an injection of Toradol and a one-time dose of prednisone.  He was advised to make an appointment for follow-up with orthopedics as directed during prior emergency department encounter.  He voiced understanding and a work note was provided.  All patient questions were answered.   ____________________________________________  FINAL CLINICAL IMPRESSION(S) / ED DIAGNOSES  Final diagnoses:  Acute pain of right shoulder      NEW MEDICATIONS STARTED DURING THIS VISIT:  ED Discharge Orders    None          This chart was dictated using voice recognition software/Dragon. Despite best efforts to proofread, errors can occur which can change the meaning. Any change was purely unintentional.     Orvil Feil, PA-C 03/04/20 1849    Shaune Pollack, MD 03/05/20 [phone]

## 2020-03-04 NOTE — ED Notes (Signed)
See triage note. Pt to room in wheelchair. Pt stating he has had unrelieved shoulder pain. Pt denies initial injury to right shoulder. Pt stating he takes OTC pain medication at home with no relief.

## 2020-03-04 NOTE — ED Triage Notes (Signed)
Pt presents to ED via POV with c/o R shoulder pain, pt states "it's the 2nd time I've been here" in relation to the R shoulder pain. Denies new injury.

## 2020-03-04 NOTE — Discharge Instructions (Signed)
Please make follow up appointment with ortho, Dr. Joice Lofts.

## 2020-04-15 ENCOUNTER — Ambulatory Visit: Payer: Self-pay | Admitting: Urology

## 2020-04-16 ENCOUNTER — Encounter: Payer: Self-pay | Admitting: Urology

## 2020-08-12 ENCOUNTER — Other Ambulatory Visit: Payer: Self-pay | Admitting: Urology

## 2020-08-12 DIAGNOSIS — M25551 Pain in right hip: Secondary | ICD-10-CM

## 2020-08-13 ENCOUNTER — Ambulatory Visit
Admission: RE | Admit: 2020-08-13 | Discharge: 2020-08-13 | Disposition: A | Discharge status: Home / Self Care | Payer: Disability Insurance | Source: Ambulatory Visit | Attending: Urology | Admitting: Urology

## 2020-08-13 ENCOUNTER — Ambulatory Visit
Admission: RE | Admit: 2020-08-13 | Discharge: 2020-08-13 | Disposition: A | Discharge status: Home / Self Care | Payer: Disability Insurance | Attending: Urology | Admitting: Urology

## 2020-08-13 DIAGNOSIS — M25551 Pain in right hip: Secondary | ICD-10-CM | POA: Diagnosis present

## 2020-08-13 DIAGNOSIS — Z96641 Presence of right artificial hip joint: Secondary | ICD-10-CM | POA: Diagnosis not present

## 2020-12-24 ENCOUNTER — Other Ambulatory Visit: Payer: Self-pay

## 2020-12-24 ENCOUNTER — Emergency Department: Payer: BLUE CROSS/BLUE SHIELD

## 2020-12-24 ENCOUNTER — Encounter: Payer: Self-pay | Admitting: Emergency Medicine

## 2020-12-24 DIAGNOSIS — Z96641 Presence of right artificial hip joint: Secondary | ICD-10-CM | POA: Diagnosis not present

## 2020-12-24 DIAGNOSIS — Z87891 Personal history of nicotine dependence: Secondary | ICD-10-CM | POA: Insufficient documentation

## 2020-12-24 DIAGNOSIS — M25551 Pain in right hip: Secondary | ICD-10-CM | POA: Insufficient documentation

## 2020-12-24 DIAGNOSIS — Z7982 Long term (current) use of aspirin: Secondary | ICD-10-CM | POA: Diagnosis not present

## 2020-12-24 DIAGNOSIS — M79651 Pain in right thigh: Secondary | ICD-10-CM | POA: Diagnosis not present

## 2020-12-24 NOTE — ED Triage Notes (Signed)
Pt to triage via w/c with no distress noted; reports Rt THR in July and is having persistent pain; st that he has not f/u with the orthopedist and wants an xray to evaluate it; denies any injury

## 2020-12-25 ENCOUNTER — Emergency Department: Payer: BLUE CROSS/BLUE SHIELD

## 2020-12-25 ENCOUNTER — Emergency Department
Admission: EM | Admit: 2020-12-25 | Discharge: 2020-12-25 | Disposition: A | Discharge status: Home / Self Care | Payer: BLUE CROSS/BLUE SHIELD | Attending: Emergency Medicine | Admitting: Emergency Medicine

## 2020-12-25 DIAGNOSIS — M25551 Pain in right hip: Secondary | ICD-10-CM

## 2020-12-25 MED ORDER — IBUPROFEN 400 MG PO TABS
400.0000 mg | ORAL_TABLET | Freq: Once | ORAL | Status: AC
  Administered 2020-12-25: 400 mg via ORAL
  Filled 2020-12-25: qty 1

## 2020-12-25 MED ORDER — LIDOCAINE 5 % EX PTCH
1.0000 | MEDICATED_PATCH | CUTANEOUS | Status: DC
  Administered 2020-12-25: 1 via TRANSDERMAL
  Filled 2020-12-25: qty 1

## 2020-12-25 MED ORDER — ACETAMINOPHEN 325 MG PO TABS
650.0000 mg | ORAL_TABLET | Freq: Once | ORAL | Status: AC
  Administered 2020-12-25: 650 mg via ORAL
  Filled 2020-12-25: qty 2

## 2020-12-25 MED ORDER — ACETAMINOPHEN 500 MG PO TABS: 1000.0000 mg | ORAL_TABLET | Freq: Once | ORAL | Status: DC

## 2020-12-25 MED ORDER — LIDOCAINE 5 % EX PTCH: 1.0000 | MEDICATED_PATCH | Freq: Two times a day (BID) | CUTANEOUS | 0 refills | Status: AC

## 2020-12-25 NOTE — Discharge Instructions (Addendum)
Your x-ray and ultrasound were negative.  Take Tylenol 1 g every 8 hours and ibuprofen 400 every 6 hours with food.  Use the lidocaine patches to help with pain.  Please follow-up with the orthopedic doctor for further recommendations.  Return to the ER for fevers, redness or any other concern

## 2020-12-25 NOTE — ED Provider Notes (Signed)
[facility] Emergency Department Provider Note  ____________________________________________   Event Date/Time   First MD Initiated Contact with Patient 12/25/20 0104     (approximate)  I have reviewed the triage vital signs and the nursing notes.   HISTORY  Chief Complaint Hip Pain    HPI Maurice Perez is a 50 y.o. male who is status post a hip replacement in July 2021 who comes in with right leg pain.  Patient reports pain in his right upper thigh.  Mostly concentrated right over the scar but some pain that is radiating down into the upper thigh, constant, still able to ambulate.  Has been taking Tylenol at home without significant relief.  He reports this pain has been gradually getting worse but is been present for a few weeks.  Denies any new falls.          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 Authorizing Provider  acetaminophen (TYLENOL) 500 MG tablet Take 1,000 mg by mouth every 6 (six) hours as needed.    [provider]  aspirin 81 MG chewable tablet Chew 1 tablet (81 mg total) by mouth 2 (two) times daily. 11/16/19   Altamese Cabal, PA-C  cyclobenzaprine (FLEXERIL) 10 MG tablet Take 1 tablet (10 mg total) by mouth 3 (three) times daily as needed. 02/25/20   Fisher, Roselyn Bering, PA-C  docusate sodium (COLACE) 100 MG capsule Take 1  capsule (100 mg total) by mouth 2 (two) times daily. 11/16/19   Altamese Cabal, PA-C  dorzolamide-timolol (COSOPT) 22.3-6.8 MG/ML ophthalmic solution Place 1 drop into both eyes in the morning and at bedtime. 05/08/19   [provider]  latanoprost (XALATAN) 0.005 % ophthalmic solution Place 1 drop into both eyes at bedtime. 07/31/19   [provider]  meloxicam (MOBIC) 15 MG tablet Take 1 tablet (15 mg total) by mouth daily. 02/25/20 02/24/21  Fisher, Roselyn Bering, PA-C  timolol (TIMOPTIC) 0.5 % ophthalmic solution Place 1 drop into both eyes in the morning and at bedtime. 05/03/19   [provider]  gabapentin (NEURONTIN) 300 MG capsule Take 1 capsule (300 mg total) by mouth 3 (three) times daily as needed (right leg pain). Patient not taking: Reported on 10/24/2019 02/27/19 02/25/20  Phineas Semen, MD    Allergies Singulair [montelukast sodium]  No family history on file.  Social History Social History   Tobacco Use   Smoking status: Former    Packs/day: 2.00    Years: 6.00    Pack years: 12.00    Types: Cigarettes    Quit date: 2016    Years since quitting: 6.6   Smokeless tobacco: Never  Vaping Use   Vaping Use: Never used  Substance Use Topics   Alcohol use: Yes  Comment: 12 PACK BEER DAILY   Drug use: No      Review of Systems Constitutional: No fever/chills Eyes: No visual changes. ENT: No sore throat. Cardiovascular: Denies chest pain. Respiratory: Denies shortness of breath. Gastrointestinal: No abdominal pain.  No nausea, no vomiting.  No diarrhea.  No constipation. Genitourinary: Negative for dysuria. Musculoskeletal: Negative for back pain.  Leg pain Skin: Negative for rash. Neurological: Negative for headaches, focal weakness or numbness. All other ROS negative ____________________________________________   PHYSICAL EXAM:  VITAL SIGNS: ED Triage Vitals  Enc Vitals Group     BP 12/24/20 2326 119/79     Pulse Rate 12/24/20  2326 78     Resp 12/24/20 2326 18     Temp 12/24/20 2326 98.5 F (36.9 C)     Temp Source 12/24/20 2326 Oral     SpO2 12/24/20 2326 99 %     Weight 12/24/20 2316 190 lb (86.2 kg)     Height 12/24/20 2316 5\' 5"  (1.651 m)     Head Circumference --      Peak Flow --      Pain Score 12/24/20 2316 8     Pain Loc --      Pain Edu? --      Excl. in [place]? --     Constitutional: Alert and oriented. Well appearing and in no acute distress. Eyes: Conjunctivae are normal. EOMI. Head: Atraumatic. Nose: No congestion/rhinnorhea. Mouth/Throat: Mucous membranes are moist.   Neck: No stridor. Trachea Midline. FROM Cardiovascular: Normal rate, regular rhythm. Grossly normal heart sounds.  Good peripheral circulation. Respiratory: Normal respiratory effort.  No retractions. Lungs CTAB. Gastrointestinal: Soft and nontender. No distention. No abdominal bruits.  Musculoskeletal: Tenderness in the right hip.  Pain along the right scar that is well-healing.  Some pain in the right upper thigh with palpation but no redness or warmth noted.  2+ distal pulse.  Range of motion intact.  No pain in the calf.  Mild swelling noted compared to the left. Neurologic:  Normal speech and language. No gross focal neurologic deficits are appreciated.  Skin:  Skin is warm, dry and intact. No rash noted. Psychiatric: Mood and affect are normal. Speech and behavior are normal. GU: Deferred   ____________________________________________  RADIOLOGY 12/26/20, personally viewed and evaluated these images (plain radiographs) as part of my medical decision making, as well as reviewing the written report by the radiologist.  ED MD interpretation: No fractures noted  Official radiology report(s): DG Hip Unilat W or Wo Pelvis 2-3 Views Right  Result Date: 12/24/2020 CLINICAL DATA:  Persistent pain in the right hip, initial encounter EXAM: DG HIP (WITH OR WITHOUT PELVIS) 2-3V RIGHT COMPARISON:  08/13/2020 FINDINGS: Right  hip replacement is noted in satisfactory position. No acute fracture or dislocation is noted. No soft tissue abnormality is seen. IMPRESSION: No acute abnormality noted. Electronically Signed   By: 10/13/2020 M.D.   On: 12/24/2020 23:38    ____________________________________________   PROCEDURES  Procedure(s) performed (including Critical Care):  Procedures   ____________________________________________   INITIAL IMPRESSION / ASSESSMENT AND PLAN / ED COURSE  PRATEEK KNIPPLE was evaluated in Emergency Department on 12/25/2020 for the symptoms described in the history of present illness. He was evaluated in the context of the global COVID-19 pandemic, which necessitated consideration that the patient might be at risk for infection with the SARS-CoV-2 virus that causes COVID-19. Institutional protocols and algorithms that pertain to the evaluation of patients at  risk for COVID-19 are in a state of rapid change based on information released by regulatory bodies including the CDC and federal and state organizations. These policies and algorithms were followed during the patient's care in the ED.     Patient comes in with concern for right hip pain.  X-ray ordered in triage to make sure no signs of dislocation, refracture.  Given patient does have some pain in his upper thigh does have some trace swelling will get ultrasound just to make sure there is no evidence of DVT that could be causing his increase in pain.  He appears vascularly intact otherwise.  The swelling could just be postoperatively.  No evidence of cellulitis or septic joint.  We will give patient some pain medication and get ultrasound if negative suspect discharge  Ultrasound is negative.  Patient is able to ambulate.  We discussed treatment management with Tylenol, ibuprofen and follow-up with Ortho.  Patient expressed understanding felt comfortable with plan  I discussed the provisional nature of ED diagnosis, the treatment so  far, the ongoing plan of care, follow up appointments and return precautions with the patient and any family or support people present. They expressed understanding and agreed with the plan, discharged home.         ____________________________________________   FINAL CLINICAL IMPRESSION(S) / ED DIAGNOSES   Final diagnoses:  Right hip pain      MEDICATIONS GIVEN DURING THIS VISIT:  Medications  lidocaine (LIDODERM) 5 % 1 patch (1 patch Transdermal Patch Applied 12/25/20 0223)  ibuprofen (ADVIL) tablet 400 mg (400 mg Oral Given 12/25/20 0223)  acetaminophen (TYLENOL) tablet 650 mg (650 mg Oral Given 12/25/20 0223)     ED Discharge Orders          Ordered    lidocaine (LIDODERM) 5 %  Every 12 hours        12/25/20 0304             Note:  This document was prepared using Dragon voice recognition software and may include unintentional dictation errors.    Concha Se, MD 12/25/20 [phone]

## 2021-09-13 IMAGING — US US EXTREM LOW VENOUS*R*
1 series · 14 of 24 positions shown · non-contrast
Comparison: None.

CLINICAL DATA: Right leg swelling,

EXAM:
RIGHT LOWER EXTREMITY VENOUS DOPPLER ULTRASOUND
TECHNIQUE: Gray-scale sonography with compression, as well as color and duplex
ultrasound, were performed to evaluate the deep venous system(s)
from the level of the common femoral vein through the popliteal and
proximal calf veins.

[Series 1: us venous img lower uni right (dvt) · portal-venous · 14 of 33 slices shown]
[im 1/33]
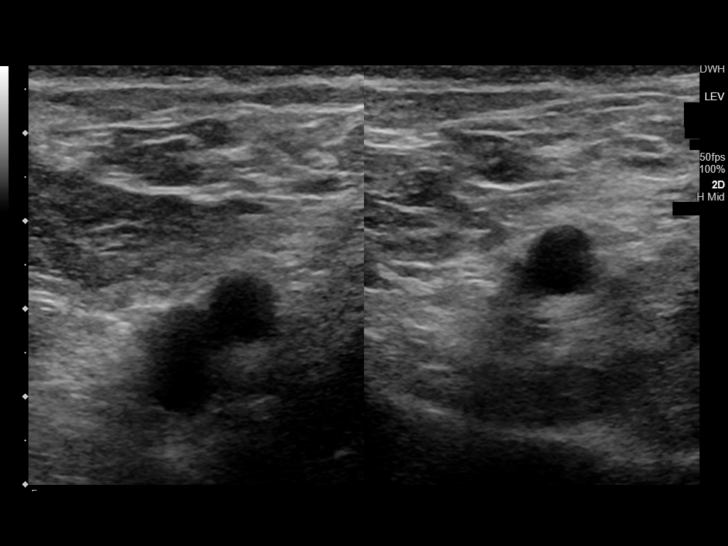
[im 3/33]
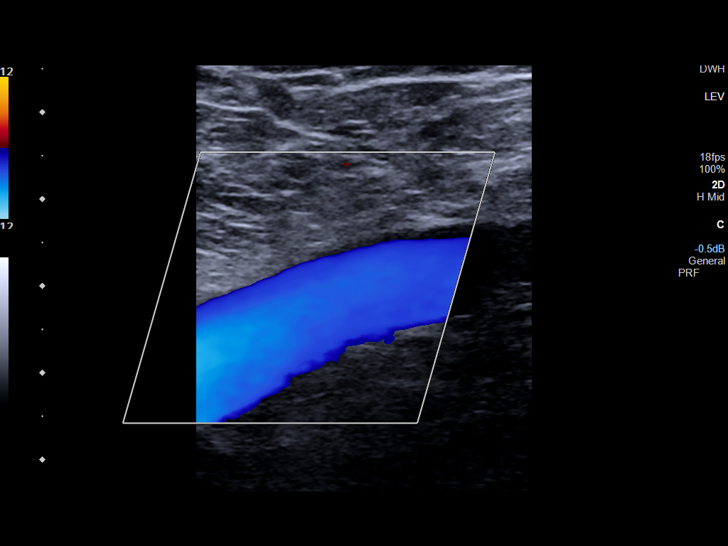
[im 6/33]
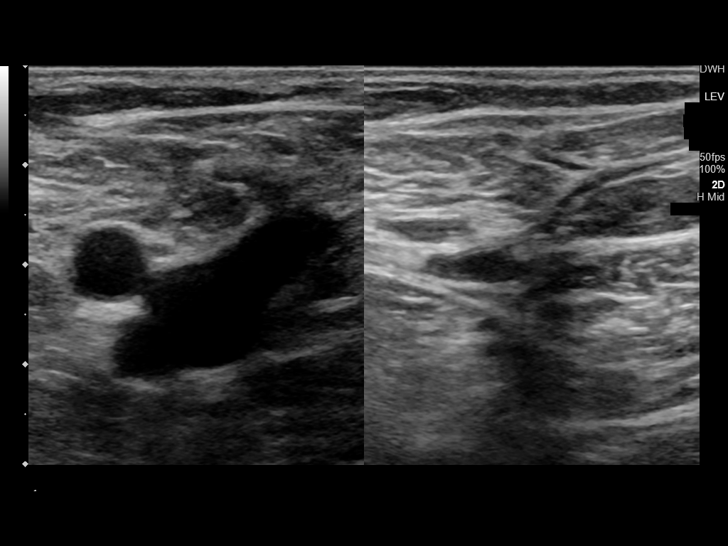
[im 9/33]
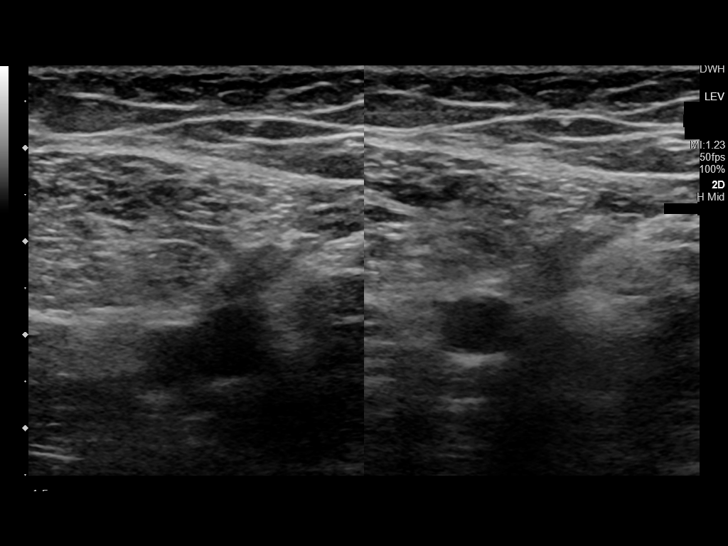
[im 10/33]
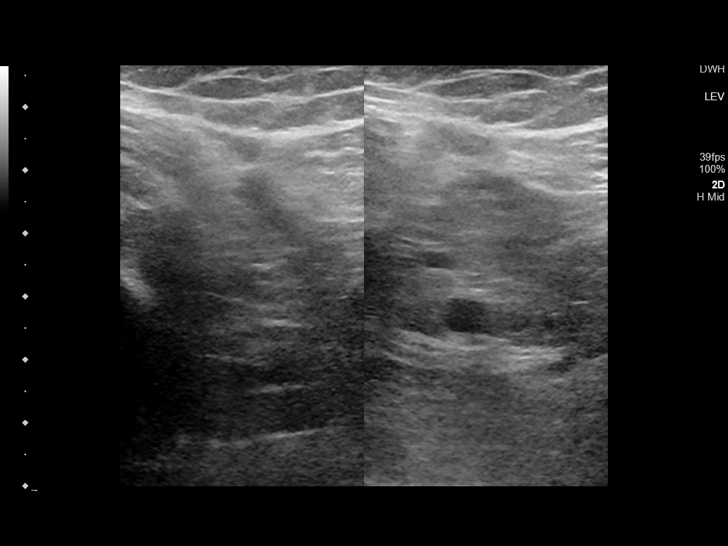
[im 13/33]
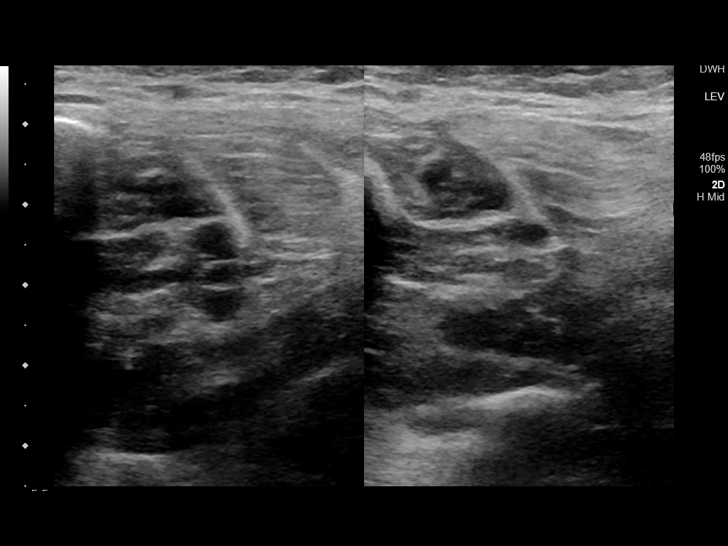
[im 16/33]
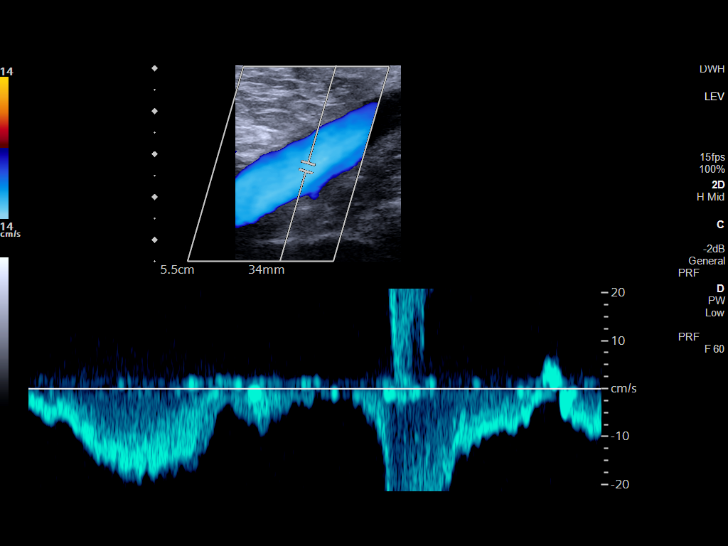
[im 17/33]
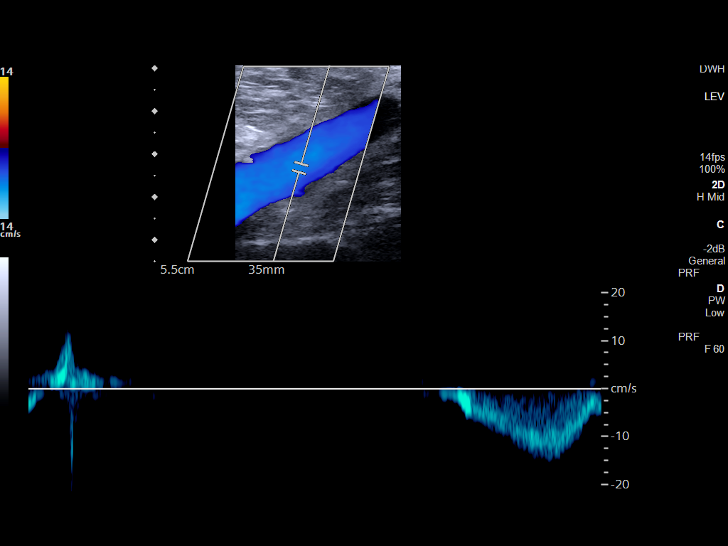
[im 20/33]
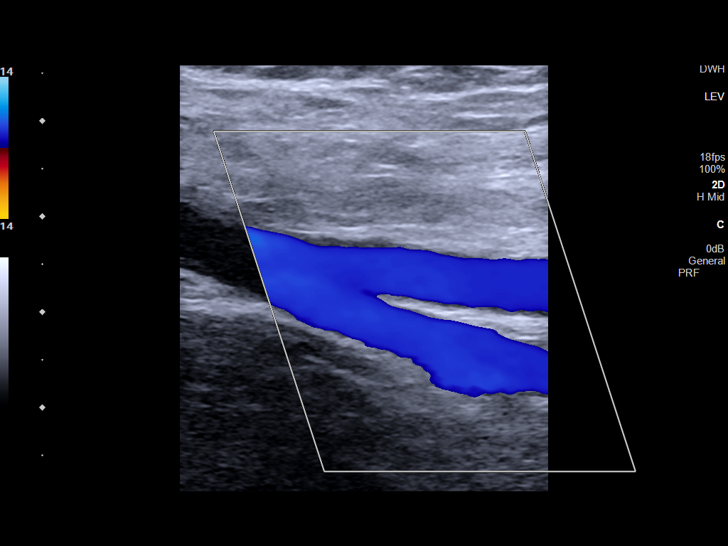
[im 23/33]
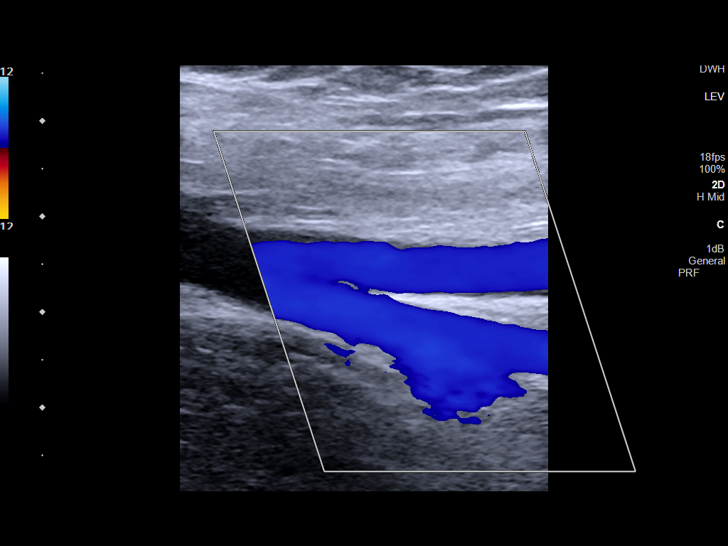
[im 26/33]
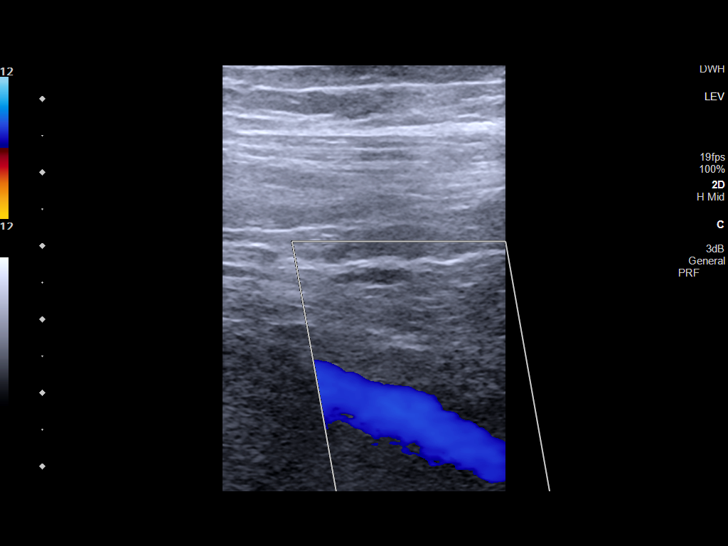
[im 27/33]
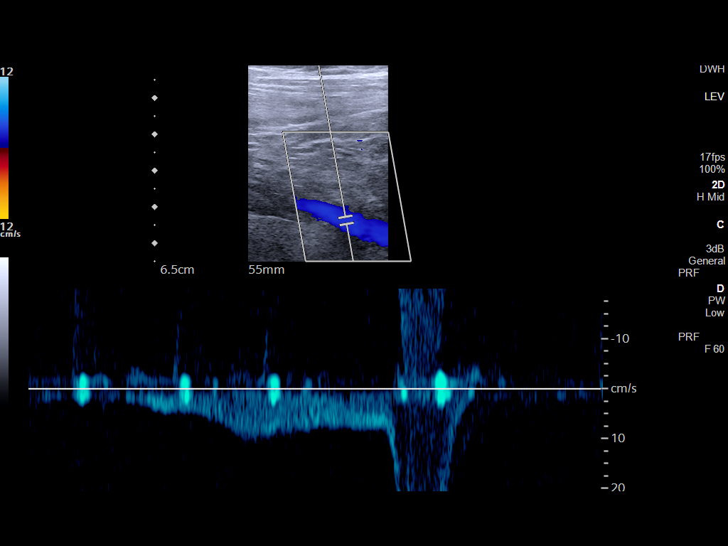
[im 30/33]
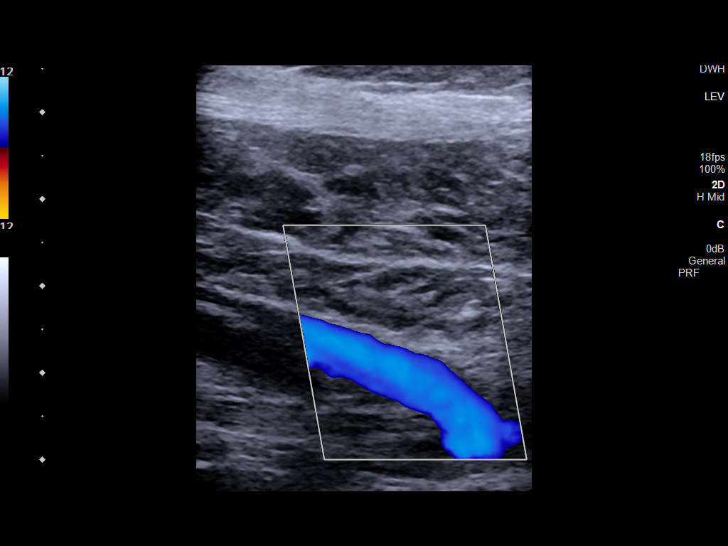
[im 33/33]
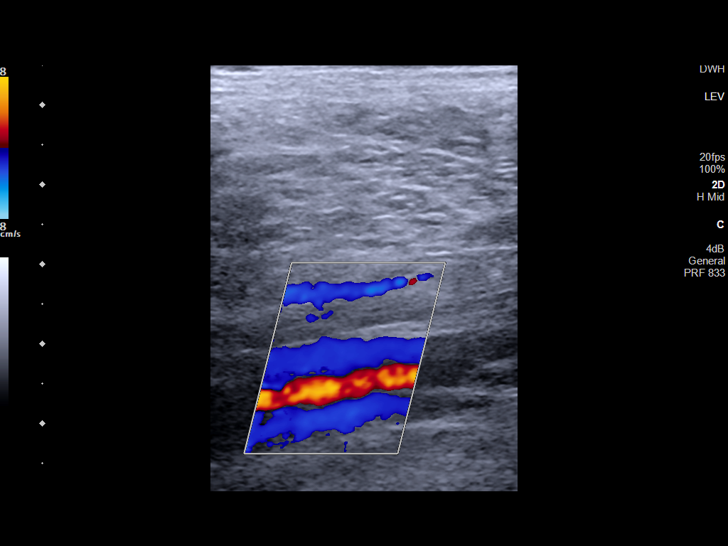

[14 of 24 positions shown; findings below may reference images not displayed]

FINDINGS: VENOUS

Normal compressibility of the common femoral, superficial femoral,
and popliteal veins, as well as the visualized calf veins.
Visualized portions of profunda femoral vein and great saphenous
vein unremarkable. No filling defects to suggest DVT on grayscale or
color Doppler imaging. Doppler waveforms show normal direction of
venous flow, normal respiratory plasticity and response to
augmentation.

Limited views of the contralateral common femoral vein are
unremarkable.

OTHER

None.

Limitations: none
IMPRESSION: Negative.

## 2022-02-01 ENCOUNTER — Encounter: Payer: Self-pay | Admitting: Emergency Medicine

## 2022-02-01 ENCOUNTER — Emergency Department
Admission: EM | Admit: 2022-02-01 | Discharge: 2022-02-01 | Disposition: A | Discharge status: Home / Self Care | Payer: BLUE CROSS/BLUE SHIELD | Attending: Emergency Medicine | Admitting: Emergency Medicine

## 2022-02-01 ENCOUNTER — Other Ambulatory Visit: Payer: Self-pay

## 2022-02-01 DIAGNOSIS — M542 Cervicalgia: Secondary | ICD-10-CM | POA: Diagnosis not present

## 2022-02-01 DIAGNOSIS — R519 Headache, unspecified: Secondary | ICD-10-CM

## 2022-02-01 MED ORDER — CYCLOBENZAPRINE HCL 10 MG PO TABS
5.0000 mg | ORAL_TABLET | Freq: Once | ORAL | Status: AC
  Administered 2022-02-01: 5 mg via ORAL
  Filled 2022-02-01: qty 1

## 2022-02-01 MED ORDER — GABAPENTIN 300 MG PO CAPS
300.0000 mg | ORAL_CAPSULE | Freq: Once | ORAL | Status: AC
  Administered 2022-02-01: 300 mg via ORAL
  Filled 2022-02-01: qty 1

## 2022-02-01 NOTE — ED Notes (Signed)
Pt reports pain is on left side of head, into jaw and down to left shoulder.

## 2022-02-01 NOTE — ED Triage Notes (Signed)
Pt reports headache for three days to the left side of his head that radiates down to his left shoulder. Pt reports has taken otc meds with little relief. Pt reports gets worse at night. Pt denies nasal congestion, cough, fevers, ear pain or other sx's

## 2022-02-01 NOTE — ED Provider Notes (Signed)
[facility] Provider Note    Event Date/Time   First MD Initiated Contact with Patient 02/01/22 1924     (approximate)   History   Headache   HPI  Maurice Perez is a 51 y.o. male  who presents to the emergency department today because of concern for left sided headache. Pain started a number of days ago. Goes from the left side of his face down to his left shoulder. Denies any injury prior to the pain starting. Denies any vision or hearing change. Did see his eye doctor after the pain started because he was worried about increased pressure in his eye. His eye doctor apparently has ordered him new glasses.    Physical Exam   Triage Vital Signs: ED Triage Vitals  Enc Vitals Group     BP 02/01/22 1757 (!) 143/91     Pulse Rate 02/01/22 1757 91     Resp 02/01/22 1757 20     Temp 02/01/22 1757 97.8 F (36.6 C)     Temp Source 02/01/22 1757 Oral     SpO2 02/01/22 1757 96 %     Weight 02/01/22 1754 191 lb 12.8 oz (87 kg)     Height 02/01/22 1754 5\' 5"  (1.651 m)     Head Circumference --      Peak Flow --      Pain Score 02/01/22 1753 8     Pain Loc --      Pain Edu? --      Excl. in [place]? --     Most recent vital signs: Vitals:   02/01/22 1920 02/01/22 1922  BP:  (!) 158/80  Pulse:  (!) 50  Resp:  16  Temp:    SpO2: 100% 100%   General: Awake, alert, oriented. CV:  Good peripheral perfusion. Bradycardia. Resp:  Normal effort.  Abd:  No distention.  Other:  Tender to palpation over the left neck and left trapezius.    ED Results / Procedures / Treatments   Labs (all labs ordered are listed, but only abnormal results are displayed) Labs Reviewed - No data to display   EKG  None   RADIOLOGY None   PROCEDURES:  Critical Care performed: No  Procedures   MEDICATIONS ORDERED IN ED: Medications - No data to display   IMPRESSION / MDM / [place] / ED COURSE  I reviewed the triage vital signs and the nursing  notes.                              Differential diagnosis includes, but is not limited to, MSK, cervical radiculopathy, dissection.  Patient's presentation is most consistent with acute presentation with potential threat to life or bodily function.  Patient presented to the emergency department today because of concern for left facial pain that radiated to his left shoulder. On exam patient is tender to palpation over the left trapezius. States that he already had his eye examened by his eye doctor. At thsi time do think likely MSK or cervical radiculopathy. Initially tried a muscle relaxer without any significant relief. However after gabapentin the patient did request discharge.    FINAL CLINICAL IMPRESSION(S) / ED DIAGNOSES   Final diagnoses:  Left-sided face pain      Note:  This document was prepared using Dragon voice recognition software and may include unintentional dictation errors.    Maurice Pear, MD 02/01/22 2110

## 2022-02-01 NOTE — ED Notes (Signed)
This RN, was not the primary nurse for this pt, this RN assisted with discharging the pt from the emergency department only. The pt has been given his AVS, the pt and family acknowledged their understanding. The pt is in no acute visible distress at the time of discharge, ambulatory with a steady gait.

## 2022-02-01 NOTE — Discharge Instructions (Signed)
Please follow up with your primary care provider. Please be sure to return for any change to your vision or hearing, any worsening pain, any fevers, change in behavior, difficulty breathing or any other new or concerning symptoms.

## 2022-09-30 ENCOUNTER — Emergency Department
Admission: EM | Admit: 2022-09-30 | Discharge: 2022-09-30 | Disposition: A | Discharge status: Home / Self Care | Payer: 59 | Attending: Emergency Medicine | Admitting: Emergency Medicine

## 2022-09-30 ENCOUNTER — Emergency Department: Payer: 59

## 2022-09-30 ENCOUNTER — Encounter: Payer: Self-pay | Admitting: Emergency Medicine

## 2022-09-30 DIAGNOSIS — M545 Low back pain, unspecified: Secondary | ICD-10-CM | POA: Diagnosis not present

## 2022-09-30 DIAGNOSIS — M546 Pain in thoracic spine: Secondary | ICD-10-CM | POA: Diagnosis not present

## 2022-09-30 DIAGNOSIS — M5136 Other intervertebral disc degeneration, lumbar region: Secondary | ICD-10-CM | POA: Diagnosis not present

## 2022-09-30 DIAGNOSIS — R001 Bradycardia, unspecified: Secondary | ICD-10-CM | POA: Diagnosis not present

## 2022-09-30 DIAGNOSIS — M5459 Other low back pain: Secondary | ICD-10-CM | POA: Diagnosis not present

## 2022-09-30 MED ORDER — CYCLOBENZAPRINE HCL 10 MG PO TABS: 10.0000 mg | ORAL_TABLET | Freq: Three times a day (TID) | ORAL | 0 refills | Status: AC | PRN

## 2022-09-30 MED ORDER — CYCLOBENZAPRINE HCL 10 MG PO TABS: 10.0000 mg | ORAL_TABLET | Freq: Once | ORAL | Status: DC

## 2022-09-30 MED ORDER — KETOROLAC TROMETHAMINE 60 MG/2ML IM SOLN
15.0000 mg | Freq: Once | INTRAMUSCULAR | Status: AC
  Administered 2022-09-30: 15 mg via INTRAMUSCULAR
  Filled 2022-09-30: qty 2

## 2022-09-30 MED ORDER — KETOROLAC TROMETHAMINE 10 MG PO TABS: 10.0000 mg | ORAL_TABLET | Freq: Four times a day (QID) | ORAL | 0 refills | Status: AC | PRN

## 2022-09-30 NOTE — ED Triage Notes (Signed)
Pt presents ambulatory to triage via POV with complaints of low back pain that started this AM after waking up. Pt rates the pain 10/10 - he last took motrin and lidocaine patches PTA with no improvement in his sx. A&Ox4 at this time. Denies urinary sx, dizziness, fevers, chills, injury, CP or SOB.

## 2022-09-30 NOTE — ED Provider Notes (Signed)
[facility] Provider Note    Event Date/Time   First MD Initiated Contact with Patient 09/30/22 [id number]     (approximate)   History   Back Pain   HPI  Maurice Perez is a 52 y.o. male with history of bradycardia, osteoarthritis, and as listed in EMR presents to the emergency department for treatment and evaluation of low back pain. No specific injury. Pain started when he rolled over to get out of bed. No relief with 1 Advil. No radiculopathy.     Physical Exam   Triage Vital Signs: ED Triage Vitals  Enc Vitals Group     BP 09/30/22 0618 138/88     Pulse Rate 09/30/22 0618 (!) 43     Resp 09/30/22 0618 20     Temp 09/30/22 0618 97.9 F (36.6 C)     Temp Source 09/30/22 0618 Oral     SpO2 09/30/22 0618 98 %     Weight 09/30/22 0617 170 lb (77.1 kg)     Height 09/30/22 0617 5\' 5"  (1.651 m)     Head Circumference --      Peak Flow --      Pain Score --      Pain Loc --      Pain Edu? --      Excl. in [place]? --     Most recent vital signs: Vitals:   09/30/22 0618  BP: 138/88  Pulse: (!) 43  Resp: 20  Temp: 97.9 F (36.6 C)  SpO2: 98%    General: Awake, no distress.  CV:  Good peripheral perfusion.  Resp:  Normal effort.  Abd:  No distention.  Other:  No focal tenderness along the length of the spine.   ED Results / Procedures / Treatments   Labs (all labs ordered are listed, but only abnormal results are displayed) Labs Reviewed - No data to display   EKG  Bradycardia, rate of 43, changes noted in comparison to EKG in 2021   RADIOLOGY  Image and radiology report reviewed and interpreted by me. Radiology report consistent with the same.  No acute findings on image of lumbar spine.  PROCEDURES:  Critical Care performed: No  Procedures   MEDICATIONS ORDERED IN ED:  Medications  ketorolac (TORADOL) injection 15 mg (15 mg Intramuscular Given 09/30/22 0748)     IMPRESSION / MDM / ASSESSMENT AND PLAN / ED COURSE   I  have reviewed the triage note.  Differential diagnosis includes, but is not limited to, osteoarthritis, sciatica.   Patient's presentation is most consistent with exacerbation of chronic illness.  52 year old male presenting to the emergency department for treatment and evaluation of nontraumatic diffuse lower back pain that started this morning while getting out of bed.  Pain does not radiate.  She has had this pain in the past but it typically goes away after he starts moving around.  Pain increases when bending forward.  Vital signs show that he is bradycardic.  Review of outside records shows that he does have a history of bradycardia in the 73s which is where he is at today.  He does not feel weak, dizzy, or near syncopal.  Toradol injection provided some relief of his back pain. He feels safe for discharge home.       FINAL CLINICAL IMPRESSION(S) / ED DIAGNOSES   Final diagnoses:  Acute midline low back pain without sciatica     Rx / DC Orders   ED Discharge Orders  Ordered    cyclobenzaprine (FLEXERIL) 10 MG tablet  3 times daily PRN        09/30/22 0812    ketorolac (TORADOL) 10 MG tablet  Every 6 hours PRN       Note to Pharmacy: Injection given in ER   09/30/22 1610             Note:  This document was prepared using Dragon voice recognition software and may include unintentional dictation errors.   Chinita Pester, FNP 09/30/22 1134    Minna Antis, MD 09/30/22 1442

## 2023-04-21 ENCOUNTER — Encounter: Payer: Self-pay | Admitting: Emergency Medicine

## 2023-04-21 ENCOUNTER — Emergency Department: Payer: 59

## 2023-04-21 ENCOUNTER — Other Ambulatory Visit: Payer: Self-pay

## 2023-04-21 DIAGNOSIS — R079 Chest pain, unspecified: Secondary | ICD-10-CM | POA: Diagnosis not present

## 2023-04-21 DIAGNOSIS — M546 Pain in thoracic spine: Secondary | ICD-10-CM | POA: Insufficient documentation

## 2023-04-21 DIAGNOSIS — R0789 Other chest pain: Secondary | ICD-10-CM | POA: Diagnosis not present

## 2023-04-21 DIAGNOSIS — Z96641 Presence of right artificial hip joint: Secondary | ICD-10-CM | POA: Diagnosis not present

## 2023-04-21 DIAGNOSIS — Z79899 Other long term (current) drug therapy: Secondary | ICD-10-CM | POA: Insufficient documentation

## 2023-04-21 DIAGNOSIS — Z7982 Long term (current) use of aspirin: Secondary | ICD-10-CM | POA: Diagnosis not present

## 2023-04-21 DIAGNOSIS — J9811 Atelectasis: Secondary | ICD-10-CM | POA: Diagnosis not present

## 2023-04-21 LAB — CBC
HCT: 42.6 % (ref 39.0–52.0)
Hemoglobin: 14.3 g/dL (ref 13.0–17.0)
MCH: 30.6 pg (ref 26.0–34.0)
MCHC: 33.6 g/dL (ref 30.0–36.0)
MCV: 91.2 fL (ref 80.0–100.0)
Platelets: 208 10*3/uL (ref 150–400)
RBC: 4.67 MIL/uL (ref 4.22–5.81)
RDW: 13.2 % (ref 11.5–15.5)
WBC: 7.5 10*3/uL (ref 4.0–10.5)
nRBC: 0 % (ref 0.0–0.2)

## 2023-04-21 LAB — BASIC METABOLIC PANEL
Anion gap: 9 (ref 5–15)
BUN: 22 mg/dL — ABNORMAL HIGH (ref 6–20)
CO2: 21 mmol/L — ABNORMAL LOW (ref 22–32)
Calcium: 8.7 mg/dL — ABNORMAL LOW (ref 8.9–10.3)
Chloride: 104 mmol/L (ref 98–111)
Creatinine, Ser: 1.06 mg/dL (ref 0.61–1.24)
GFR, Estimated: >60 mL/min (ref >60)
Glucose, Bld: 109 mg/dL — ABNORMAL HIGH (ref 70–99)
Potassium: 3.5 mmol/L (ref 3.5–5.1)
Sodium: 134 mmol/L — ABNORMAL LOW (ref 135–145)

## 2023-04-21 LAB — TROPONIN I (HIGH SENSITIVITY): Troponin I (High Sensitivity): 4 ng/L (ref <18)

## 2023-04-21 NOTE — ED Triage Notes (Signed)
Patient ambulatory to triage with complaints of right sided rib pain all day today. Patient denies trauma/injury. States it feels sharp/stabbing. Denies other accompanying symptoms such as nausea, shortness of breath, dizziness.

## 2023-04-22 ENCOUNTER — Emergency Department
Admission: EM | Admit: 2023-04-22 | Discharge: 2023-04-22 | Disposition: A | Discharge status: Home / Self Care | Payer: 59 | Attending: Emergency Medicine | Admitting: Emergency Medicine

## 2023-04-22 DIAGNOSIS — M546 Pain in thoracic spine: Secondary | ICD-10-CM

## 2023-04-22 LAB — HEPATIC FUNCTION PANEL
ALT: 15 U/L (ref 0–44)
AST: 18 U/L (ref 15–41)
Albumin: 3.8 g/dL (ref 3.5–5.0)
Alkaline Phosphatase: 62 U/L (ref 38–126)
Bilirubin, Direct: 0.2 mg/dL (ref 0.0–0.2)
Indirect Bilirubin: 0.8 mg/dL (ref 0.3–0.9)
Total Bilirubin: 1 mg/dL (ref <1.2)
Total Protein: 7 g/dL (ref 6.5–8.1)

## 2023-04-22 LAB — LIPASE, BLOOD: Lipase: 33 U/L (ref 11–51)

## 2023-04-22 LAB — TROPONIN I (HIGH SENSITIVITY): Troponin I (High Sensitivity): 4 ng/L (ref <18)

## 2023-04-22 MED ORDER — IBUPROFEN 800 MG PO TABS: 800.0000 mg | ORAL_TABLET | Freq: Three times a day (TID) | ORAL | 0 refills | Status: AC | PRN

## 2023-04-22 MED ORDER — LIDOCAINE 5 % EX PTCH: 1.0000 | MEDICATED_PATCH | CUTANEOUS | 0 refills | Status: AC

## 2023-04-22 MED ORDER — METHOCARBAMOL 500 MG PO TABS: 500.0000 mg | ORAL_TABLET | Freq: Three times a day (TID) | ORAL | 0 refills | Status: AC | PRN

## 2023-04-22 MED ORDER — METHOCARBAMOL 500 MG PO TABS
500.0000 mg | ORAL_TABLET | Freq: Once | ORAL | Status: AC
  Administered 2023-04-22: 500 mg via ORAL
  Filled 2023-04-22: qty 1

## 2023-04-22 MED ORDER — LIDOCAINE 5 % EX PTCH
1.0000 | MEDICATED_PATCH | Freq: Once | CUTANEOUS | Status: DC
  Administered 2023-04-22: 1 via TRANSDERMAL
  Filled 2023-04-22: qty 1

## 2023-04-22 MED ORDER — IBUPROFEN 800 MG PO TABS
800.0000 mg | ORAL_TABLET | Freq: Once | ORAL | Status: AC
  Administered 2023-04-22: 800 mg via ORAL
  Filled 2023-04-22: qty 1

## 2023-04-22 NOTE — Discharge Instructions (Addendum)
You may alternate Tylenol 1000 mg every 6 hours as needed for pain, fever and Ibuprofen 800 mg every 6-8 hours as needed for pain, fever.  Please take Ibuprofen with food.  Do not take more than 4000 mg of Tylenol (acetaminophen) in a 24 hour period. ° °

## 2023-04-22 NOTE — ED Provider Notes (Signed)
[facility] Provider Note    Event Date/Time   First MD Initiated Contact with Patient 04/22/23 [phone]     (approximate)   History   Chest Pain   HPI  Maurice Perez is a 52 y.o. male history of GERD who presents to the emergency department with complaints of pain in the right mid back that radiates into the abdomen.  Worse with movement.  Feels like a muscle spasm.  Denies any known injury.  No numbness, tingling, weakness, bowel or bladder incontinence.  No fevers, vomiting or diarrhea.  No chest pain or shortness of breath.  No history of PE, DVT, exogenous estrogen use, recent fractures, surgery, trauma, hospitalization, prolonged travel or other immobilization. No lower extremity swelling or pain. No calf tenderness.  History of kidney stone.  Denies dysuria or hematuria.   History provided by patient.    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 Authorizing Provider  acetaminophen (TYLENOL) 500 MG tablet Take 1,000 mg by mouth every 6 (six) hours as needed.    [provider]  aspirin 81 MG chewable tablet Chew 1 tablet (81 mg total) by mouth 2 (two) times daily. 11/16/19   Altamese Cabal, PA-C  cyclobenzaprine (FLEXERIL) 10 MG tablet Take 1 tablet (10 mg total) by mouth 3 (three) times daily as needed. 09/30/22   Triplett, Cari B, FNP  docusate sodium (COLACE) 100 MG capsule Take 1 capsule (100 mg total) by mouth 2 (two) times daily.  11/16/19   Altamese Cabal, PA-C  dorzolamide-timolol (COSOPT) 22.3-6.8 MG/ML ophthalmic solution Place 1 drop into both eyes in the morning and at bedtime. 05/08/19   [provider]  ketorolac (TORADOL) 10 MG tablet Take 1 tablet (10 mg total) by mouth every 6 (six) hours as needed. 09/30/22   Triplett, Cari B, FNP  latanoprost (XALATAN) 0.005 % ophthalmic solution Place 1 drop into both eyes at bedtime. 07/31/19   [provider]  timolol (TIMOPTIC) 0.5 % ophthalmic solution Place 1 drop into both eyes in the morning and at bedtime. 05/03/19   [provider]  gabapentin (NEURONTIN) 300 MG capsule Take 1 capsule (300 mg total) by mouth 3 (three) times daily as needed (right leg pain). Patient not taking: Reported on 10/24/2019 02/27/19 02/25/20  Phineas Semen, MD    Physical Exam   Triage Vital Signs: ED Triage Vitals  Encounter Vitals Group     BP 04/21/23 2037 (!) 142/87     Systolic BP Percentile --      Diastolic BP Percentile --      Pulse Rate 04/21/23 2037 93     Resp 04/21/23 2037 20     Temp 04/21/23 2037 99.3 F (37.4 C)     Temp Source 04/21/23 2037 Oral     SpO2 04/21/23 2037 96 %     Weight 04/21/23 2038 175 lb (  79.4 kg)     Height 04/21/23 2038 5\' 5"  (1.651 m)     Head Circumference --      Peak Flow --      Pain Score 04/21/23 2037 10     Pain Loc --      Pain Education --      Exclude from Growth Chart --     Most recent vital signs: Vitals:   04/22/23 0332 04/22/23 0345  BP: (!) 143/79   Pulse: 83 82  Resp: 17   Temp:    SpO2: 98% 98%    CONSTITUTIONAL: Alert, responds appropriately to questions. Well-appearing; well-nourished HEAD: Normocephalic, atraumatic EYES: Conjunctivae clear, pupils appear equal, sclera nonicteric ENT: normal nose; moist mucous membranes NECK: Supple, normal ROM CARD: RRR; S1 and S2 appreciated RESP: Normal chest excursion without splinting or tachypnea; breath sounds clear and equal bilaterally; no  wheezes, no rhonchi, no rales, no hypoxia or respiratory distress, speaking full sentences ABD/GI: Non-distended; soft, non-tender, no rebound, no guarding, no peritoneal signs BACK: The back appears normal, no midline spinal tenderness, step-off or deformity.  Patient is tender to palpation over the right thoracic paraspinal muscles.   There are no overlying skin changes. EXT: Normal ROM in all joints; no deformity noted, no edema SKIN: Normal color for age and race; warm; no rash on exposed skin NEURO: Moves all extremities equally, normal speech, normal sensation, normal gait PSYCH: The patient's mood and manner are appropriate.   ED Results / Procedures / Treatments   LABS: (all labs ordered are listed, but only abnormal results are displayed) Labs Reviewed  BASIC METABOLIC PANEL - Abnormal; Notable for the following components:      Result Value   Sodium 134 (*)    CO2 21 (*)    Glucose, Bld 109 (*)    BUN 22 (*)    Calcium 8.7 (*)    All other components within normal limits  CBC  HEPATIC FUNCTION PANEL  LIPASE, BLOOD  TROPONIN I (HIGH SENSITIVITY)  TROPONIN I (HIGH SENSITIVITY)     EKG:  EKG Interpretation Date/Time:  Wednesday April 21 2023 20:43:50 EST Ventricular Rate:  87 PR Interval:  148 QRS Duration:  80 QT Interval:  346 QTC Calculation: 416 R Axis:   62  Text Interpretation: Normal sinus rhythm Possible Left atrial enlargement Possible Anterior infarct (cited on or before 30-Sep-2022) Abnormal ECG When compared with ECG of 30-Sep-2022 06:22, Vent. rate has increased BY  45 BPM Questionable change in initial forces of Septal leads Confirmed by Rochele Raring [phone]) on 04/22/2023 3:28:10 AM         RADIOLOGY: My personal review and interpretation of imaging: Chest x-ray unremarkable.  I have personally reviewed all radiology reports.   DG Chest 2 View Result Date: 04/21/2023 CLINICAL DATA:  Right-sided chest pain, initial encounter EXAM: CHEST - 2  VIEW COMPARISON:  03/05/2018 FINDINGS: Cardiac shadow is within normal limits. Lungs are well aerated bilaterally. Right basilar atelectasis is seen without associated effusion. No bony abnormality is seen. Upper abdomen is within normal limits. IMPRESSION: Right basilar atelectasis. Electronically Signed   By: Alcide Clever M.D.   On: 04/21/2023 21:27     PROCEDURES:  Critical Care performed: No      Procedures    IMPRESSION / MDM / ASSESSMENT AND PLAN / ED COURSE  I reviewed the triage vital signs and the nursing notes.    Patient here with right-sided thoracic back pain.  Worse with movement.  States this feels like a muscle spasm.    DIFFERENTIAL DIAGNOSIS (includes but not limited to):   Thoracic strain, muscle spasm, doubt cauda equina, epidural abscess or hematoma, discitis or osteomyelitis, transverse myelitis.  Symptoms atypical for ACS.  Negative Murphy sign.  Low suspicion for cholelithiasis, cholecystitis, pancreatitis.  Less likely PE, pneumonia.  Doubt UTI, kidney stone, hydronephrosis.  Patient's presentation is most consistent with acute presentation with potential threat to life or bodily function.   PLAN: Patient's labs from triage are unremarkable.  No leukocytosis.  Normal hemoglobin.  Normal creatinine.  Will add on LFTs and lipase given the location of pain but abdominal exam is benign.  Troponin x 2 negative but this seems very atypical for ACS.  Chest x-ray reviewed and interpreted by myself and the radiologist and shows atelectasis but no pneumonia.  He is not having any shortness of breath, fever or cough.  No risk factors for PE other than age.  Will give Lidoderm patch, ibuprofen, Robaxin and reassess.   MEDICATIONS GIVEN IN ED: Medications  lidocaine (LIDODERM) 5 % 1 patch (1 patch Transdermal Patch Applied 04/22/23 0346)  methocarbamol (ROBAXIN) tablet 500 mg (500 mg Oral Given 04/22/23 0346)  ibuprofen (ADVIL) tablet 800 mg (800 mg Oral Given  04/22/23 0346)     ED COURSE: LFTs and lipase unremarkable.  Patient reports feeling better after medications.  He is ambulatory.  I feel he is safe for discharge.  Will provide with prescriptions and work note.  Recommended stretching, alternating ice and heat.  Patient verbalized understanding and is comfortable with this plan.   At this time, I do not feel there is any life-threatening condition present. I reviewed all nursing notes, vitals, pertinent previous records.  All lab and urine results, EKGs, imaging ordered have been independently reviewed and interpreted by myself.  I reviewed all available radiology reports from any imaging ordered this visit.  Based on my assessment, I feel the patient is safe to be discharged home without further emergent workup and can continue workup as an outpatient as needed. Discussed all findings, treatment plan as well as usual and customary return precautions.  They verbalize understanding and are comfortable with this plan.  Outpatient follow-up has been provided as needed.  All questions have been answered.    CONSULTS:  none   OUTSIDE RECORDS REVIEWED: Reviewed last ENT urology note in January 2024 for hydrocele.       FINAL CLINICAL IMPRESSION(S) / ED DIAGNOSES   Final diagnoses:  Acute right-sided thoracic back pain     Rx / DC Orders   ED Discharge Orders          Ordered    methocarbamol (ROBAXIN) 500 MG tablet  Every 8 hours PRN        04/22/23 0409    lidocaine (LIDODERM) 5 %  Every 24 hours        04/22/23 0409    ibuprofen (ADVIL) 800 MG tablet  Every 8 hours PRN        04/22/23 0409             Note:  This document was prepared using Dragon voice recognition software and may include unintentional dictation errors.   Markel Kurtenbach, Layla Maw, DO 04/22/23 [phone]

## 2023-11-29 ENCOUNTER — Other Ambulatory Visit: Payer: Self-pay | Admitting: Nurse Practitioner

## 2023-11-29 DIAGNOSIS — N5089 Other specified disorders of the male genital organs: Secondary | ICD-10-CM

## 2023-12-01 ENCOUNTER — Ambulatory Visit

## 2023-12-07 ENCOUNTER — Ambulatory Visit
Admission: RE | Admit: 2023-12-07 | Discharge: 2023-12-07 | Disposition: A | Discharge status: Home / Self Care | Source: Ambulatory Visit | Attending: Nurse Practitioner | Admitting: Nurse Practitioner

## 2023-12-07 DIAGNOSIS — N5089 Other specified disorders of the male genital organs: Secondary | ICD-10-CM | POA: Diagnosis not present

## 2023-12-07 DIAGNOSIS — N433 Hydrocele, unspecified: Secondary | ICD-10-CM | POA: Diagnosis not present

## 2024-02-08 ENCOUNTER — Ambulatory Visit: Admitting: Urology

## 2024-03-01 DIAGNOSIS — R109 Unspecified abdominal pain: Secondary | ICD-10-CM | POA: Diagnosis not present

## 2024-03-01 DIAGNOSIS — Z1331 Encounter for screening for depression: Secondary | ICD-10-CM | POA: Diagnosis not present

## 2024-03-01 DIAGNOSIS — R7309 Other abnormal glucose: Secondary | ICD-10-CM | POA: Diagnosis not present

## 2024-03-01 DIAGNOSIS — R001 Bradycardia, unspecified: Secondary | ICD-10-CM | POA: Diagnosis not present

## 2024-03-01 DIAGNOSIS — Z23 Encounter for immunization: Secondary | ICD-10-CM | POA: Diagnosis not present

## 2024-03-01 DIAGNOSIS — N433 Hydrocele, unspecified: Secondary | ICD-10-CM | POA: Diagnosis not present

## 2024-03-01 DIAGNOSIS — Z1389 Encounter for screening for other disorder: Secondary | ICD-10-CM | POA: Diagnosis not present

## 2024-03-07 ENCOUNTER — Encounter: Payer: Self-pay | Admitting: Urology

## 2024-03-07 ENCOUNTER — Ambulatory Visit (INDEPENDENT_AMBULATORY_CARE_PROVIDER_SITE_OTHER): Admitting: Urology

## 2024-03-07 VITALS — BP 123/75 | HR 96 | Ht 65.0 in | Wt 170.0 lb

## 2024-03-07 DIAGNOSIS — N433 Hydrocele, unspecified: Secondary | ICD-10-CM | POA: Diagnosis not present

## 2024-03-07 NOTE — Progress Notes (Signed)
 03/07/2024 10:13 AM   Maurice Perez April 09, 1971 [id number]  Referring provider: Debarah Catheryn JINNY, FNP [address] [place],  [place] [address]  Chief Complaint  Patient presents with   Hydrocele    HPI: Maurice Perez is a 53 y.o. male referred for evaluation of a left hydrocele.  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 If you have any questions, ask your nurse or doctor.          STOP taking these medications    gabapentin  300 MG capsule Commonly known as: Neurontin  Stopped by: Maurice Perez       TAKE these medications    acetaminophen  500 MG tablet Commonly known as: TYLENOL  Take 1,000 mg by mouth every 6 (six) hours as needed.   aspirin  81 MG chewable tablet Chew 1 tablet (81 mg total) by mouth 2 (two) times daily.   cyclobenzaprine  10 MG tablet Commonly known as: FLEXERIL  Take 1 tablet (10 mg total) by mouth 3 (three) times daily as needed.   docusate sodium  100 MG capsule Commonly known as: COLACE Take 1 capsule (100 mg total) by mouth 2 (two) times daily.   dorzolamide -timolol  2-0.5 % ophthalmic  solution Commonly known as: COSOPT  Place 1 drop into both eyes in the morning and at bedtime.   ibuprofen  800 MG tablet Commonly known as: ADVIL  Take 1 tablet (800 mg total) by mouth every 8 (eight) hours as needed.   ketorolac  10 MG tablet Commonly known as: TORADOL  Take 1 tablet (10 mg total) by mouth every 6 (six) hours as needed.   latanoprost  0.005 % ophthalmic solution Commonly known as: XALATAN  Place 1 drop into both eyes at bedtime.   lidocaine  5 % Commonly known as: Lidoderm  Place 1 patch onto the skin daily. Remove & Discard patch within 12 hours or as directed by MD   methocarbamol  500 MG tablet Commonly known as: ROBAXIN  Take 1 tablet (500 mg total) by mouth every 8 (eight) hours as needed.   naproxen 500 MG tablet Commonly known as: NAPROSYN Take 500 mg by mouth 2 (two) times daily.   timolol  0.5 % ophthalmic solution Commonly known as: TIMOPTIC  Place 1 drop into both eyes in the morning and at bedtime.        Allergies:  Allergies  Allergen Reactions   Singulair [Montelukast Sodium] Hives    Family History: History reviewed. No pertinent family history.  Social History:  reports that he quit smoking about 9 years ago. His smoking use included cigarettes.  He started smoking about 15 years ago. He has a 12 pack-year smoking history. He has never used smokeless tobacco. He reports current alcohol use. He reports that he does not use drugs.   Physical Exam: BP 123/75   Pulse 96   Ht 5' 5 (1.651 m)   Wt 170 lb (77.1 kg)   BMI 28.29 kg/m   Constitutional:  Alert, No acute distress. HEENT: [place] AT Respiratory: Normal respiratory effort, no increased work of breathing. GU: Phallus without lesions.  Right testis descended palpably normal.  Moderate left hydrocele.  Left testis not palpable.  No hernia Psychiatric: Normal mood and affect.   Assessment & Plan:    1.  Left hydrocele Mild symptoms Management options were discussed including  observation, aspiration and hydrocelectomy. He is interested in pursuing aspiration.  We did discuss the high recurrence rate with aspiration   Maurice JAYSON Barba, MD  [facility] [address] [place], [place] [address] [phone]

## 2024-03-21 ENCOUNTER — Encounter: Payer: Self-pay | Admitting: *Deleted

## 2024-03-28 ENCOUNTER — Ambulatory Visit: Attending: Cardiovascular Disease | Admitting: Cardiovascular Disease

## 2024-04-04 ENCOUNTER — Encounter: Payer: Self-pay | Admitting: Urology

## 2024-04-04 ENCOUNTER — Ambulatory Visit (INDEPENDENT_AMBULATORY_CARE_PROVIDER_SITE_OTHER): Admitting: Urology

## 2024-04-04 VITALS — BP 138/77 | HR 46 | Ht 65.0 in | Wt 170.0 lb

## 2024-04-04 DIAGNOSIS — N433 Hydrocele, unspecified: Secondary | ICD-10-CM

## 2024-04-04 NOTE — Progress Notes (Signed)
   Procedure note  Diagnosis: Left hydrocele  Procedure: Hydrocele aspiration  Anesthesia: 1% plain Xylocaine -1 cc  Description: Scrotal ultrasound was performed to confirm testis location was posterior.  The anterior scrotal skin was anesthetized with 1 mL of 1% plain Xylocaine .  A 20-gauge angiocatheter was inserted through the anesthetized area.  The needle was removed and 115 mL of straw-colored fluid was aspirated.  Complications: None  Plan:  We discussed the possibility of hydrocele recurrence Consider hydrocelectomy if early recurrence and hydrocele as well as Call for fever, scrotal erythema    Maurice Barba, MD

## 2024-04-17 NOTE — Progress Notes (Unsigned)
  Cardiology Office Note   Date:  04/18/2024  ID:  Alija, Riano [birth year], MRN [id number] PCP: [facility]  [facility] Providers Cardiologist:  Caron Poser, MD     History of Present Illness Maurice Perez is a 53 y.o. male no relevant PMH who presents for further evaluation management of bradycardia.  Seen by PCP for this issue 02/2024.  LDL 96 02/2024.  No recent TSH on file.  Patient reports he is mostly asymptomatic aside from occasional chest discomfort.  The chest discomfort sometimes occurs with exertion, sometimes at rest.  It is not particularly frequent.  He denies any syncope, dyspnea on exertion, orthopnea, LE edema, or any other significant symptoms.  Relevant CVD History -None   ROS: Pt denies any jaw pain, arm pain, palpitations, syncope, presyncope, orthopnea, PND, or LE edema.  Studies Reviewed I have independently reviewed the patient's ECG, recent medical records, recent blood work.  Physical Exam VS:  BP 98/62 (BP Location: Left Arm, Patient Position: Sitting, Cuff Size: Large)   Pulse (!) 49   Ht 5' 5 (1.651 m)   Wt 170 lb (77.1 kg)   SpO2 96%   BMI 28.29 kg/m        Wt Readings from Last 3 Encounters:  04/18/24 170 lb (77.1 kg)  04/04/24 170 lb (77.1 kg)  03/07/24 170 lb (77.1 kg)    GEN: No acute distress. NECK: No JVD; No carotid bruits. CARDIAC: RRR, no murmurs, rubs, gallops. RESPIRATORY:  Clear to auscultation. EXTREMITIES:  Warm and well-perfused. No edema.  ASSESSMENT AND PLAN Chest discomfort Family history of premature ASCVD Bradycardia Hypotension Patient presents with intermittent chest discomfort.  He also has undifferentiated hypotension and bradycardia.  His mother reportedly had premature ASCVD.  Further investigation is warranted.  Plan: - Coronary CT angiogram to evaluate obstructive CAD - Echocardiogram to evaluate for structural causes - Zio monitor to ensure no high risk  bradycardia arrhythmias and to further characterize bradycardia - Thyroid panel given bradycardia        Dispo: RTC 3 months or sooner as needed  Signed, Caron Poser, MD

## 2024-04-18 ENCOUNTER — Ambulatory Visit

## 2024-04-18 ENCOUNTER — Ambulatory Visit (INDEPENDENT_AMBULATORY_CARE_PROVIDER_SITE_OTHER)

## 2024-04-18 VITALS — BP 98/62 | HR 49 | Ht 65.0 in | Wt 170.0 lb

## 2024-04-18 DIAGNOSIS — R001 Bradycardia, unspecified: Secondary | ICD-10-CM

## 2024-04-18 DIAGNOSIS — R079 Chest pain, unspecified: Secondary | ICD-10-CM

## 2024-04-18 DIAGNOSIS — R072 Precordial pain: Secondary | ICD-10-CM

## 2024-04-18 DIAGNOSIS — Z79899 Other long term (current) drug therapy: Secondary | ICD-10-CM | POA: Diagnosis not present

## 2024-04-18 DIAGNOSIS — I9589 Other hypotension: Secondary | ICD-10-CM | POA: Diagnosis not present

## 2024-04-18 DIAGNOSIS — Z8249 Family history of ischemic heart disease and other diseases of the circulatory system: Secondary | ICD-10-CM

## 2024-04-18 NOTE — Patient Instructions (Addendum)
 Medication Instructions:  Your physician recommends that you continue on your current medications as directed. Please refer to the Current Medication list given to you today.  *If you need a refill on your cardiac medications before your next appointment, please call your pharmacy*  Lab Work: Your provider would like for you to have following labs drawn today TSH PANEL, BMP.   If you have labs (blood work) drawn today and your tests are completely normal, you will receive your results only by: MyChart Message (if you have MyChart) OR A paper copy in the mail If you have any lab test that is abnormal or we need to change your treatment, we will call you to review the results.  Testing/Procedures:   CORONARY CT SCAN:  (SCHEDULE AFTER 05/08/2024)  Your cardiac CT will be scheduled at one of the below locations:   [facility] [address] [place], [place] [address] [phone]  Please arrive 15 mins early for check-in and test prep.  There is spacious parking and easy access to the radiology department from the [facility] entrance. Please enter here and check-in with the desk attendant.   Please follow these instructions carefully (unless otherwise directed):  An IV will be required for this test and Nitroglycerin will be given.    On the Night Before the Test: Be sure to Drink plenty of water. Do not consume any caffeinated/decaffeinated beverages or chocolate 12 hours prior to your test. Do not take any antihistamines 12 hours prior to your test.   On the Day of the Test: Drink plenty of water until 1 hour prior to the test. Do not eat any food 1 hour prior to test. You may take your regular medications prior to the test.     After the Test: Drink plenty of water. After receiving IV contrast, you may experience a mild flushed feeling. This is normal. On occasion, you may experience a mild rash up to 24 hours after the test. This is  not dangerous. If this occurs, you can take Benadryl 25 mg, Zyrtec, Claritin, or Allegra and increase your fluid intake. (Patients taking Tikosyn should avoid Benadryl, and may take Zyrtec, Claritin, or Allegra) If you experience trouble breathing, this can be serious. If it is severe call 911 IMMEDIATELY. If it is mild, please call our office.  We will call to schedule your test 2-4 weeks out understanding that some insurance companies will need an authorization prior to the service being performed.   For more information and frequently asked questions, please visit our website : [email or web address]  For non-scheduling related questions, please contact the cardiac imaging nurse navigator should you have any questions/concerns: Cardiac Imaging Nurse Navigators Direct Office Dial: [phone]   For scheduling needs, including cancellations and rescheduling, please call Brittany, [phone].    ECHOCARDIOGRAM:  (SCHEDULE AFTER 05/08/2024)  Your physician has requested that you have an echocardiogram. Echocardiography is a painless test that uses sound waves to create images of your heart. It provides your doctor with information about the size and shape of your heart and how well your heart's chambers and valves are working.   You may receive an ultrasound enhancing agent through an IV if needed to better visualize your heart during the echo. This procedure takes approximately one hour.  There are no restrictions for this procedure.  This will take place at [address] [facility] [address], [place] [address]  Please note: We ask at that you  not bring children with you during ultrasound (echo/ vascular) testing. Due to room size and safety concerns, children are not allowed in the ultrasound rooms during exams. Our front office staff cannot provide observation of children in our lobby area while testing is being conducted. An adult accompanying a patient to their  appointment will only be allowed in the ultrasound room at the discretion of the ultrasound technician under special circumstances. We apologize for any inconvenience.   ZIO HEART MONITOR:  ZIO XT- Long Term Monitor Instructions  Your physician has requested you wear a ZIO patch monitor for 14 days.  This is a single patch monitor. [employer] supplies one patch monitor per enrollment. Additional stickers are not available. Please do not apply patch if you will be having a Nuclear Stress Test, Echocardiogram, Cardiac CT, MRI, or Chest Xray during the period you would be wearing the monitor. The patch cannot be worn during these tests. You cannot remove and re-apply the ZIO XT patch monitor.  Your ZIO patch monitor will be mailed 3 day USPS to your address on file. It may take 3-5 days to receive your monitor after you have been enrolled. Once you have received your monitor, please review the enclosed instructions. Your monitor has already been registered assigning a specific monitor serial number to you.  Billing and Patient Assistance Program Information  We have supplied [employer] with any of your insurance information on file for billing purposes.  [employer] offers a sliding scale Patient Assistance Program for patients that do not have insurance, or whose insurance does not completely cover the cost of the ZIO monitor.  You must apply for the Patient Assistance Program to qualify for this discounted rate.  To apply, please call [employer] at [phone], select option 4, select option 2, ask to apply for Patient Assistance Program. Meredeth will ask your household income, and how many people are in your household. They will quote your out-of-pocket cost based on that information. [employer] will also be able to set up a 56-month, interest-free payment plan if needed.  Applying the monitor  (Completed In Clinic)   Hold abrader disc by orange tab. Rub abrader in 40 strokes over the upper left chest as indicated  in your monitor instructions.  Clean area with 4 enclosed alcohol pads. Let dry.  Apply patch as indicated in monitor instructions. Patch will be placed under collarbone on left side of chest with arrow pointing upward.  Rub patch adhesive wings for 2 minutes. Remove white label marked 1. Remove the white label marked 2. Rub patch adhesive wings for 2 additional minutes.  While looking in a mirror, press and release button in center of patch. A small green light will flash 3-4 times. This will be your only indicator that the monitor has been turned on.   After Applying Monitor: Do not shower for the first 24 hours. You may shower after the first 24 hours.  Press the button if you feel a symptom. You will hear a small click. Record Date, Time and Symptom in the Patient Logbook.   After Completing 14 Days: When you are ready to remove the patch, follow instructions on the last 2 pages of Patient Logbook.  Stick patch monitor into the tabs at the bottom of the return box.  Place Patient Logbook in the blue and white box. Use locking tab on box and tape box closed securely. The blue and white box has prepaid postage on it. Please place it in the mailbox as  soon as possible. Your physician should have your test results approximately 7-14 days after the monitor has been mailed back to [facility].   Troubleshooting: Call [facility] at [phone] if you have questions regarding your ZIO XT patch monitor.  Call them immediately if you see an orange light blinking on your monitor.  If your monitor falls off in less than 4 days, contact our Monitor department at [phone].  If your monitor becomes loose or falls off after 4 days call [employer] at [phone] for suggestions on securing your monitor.   Follow-Up: At [facility], you and your health needs are our priority.  As part of our continuing mission to provide you with exceptional heart care, our providers  are all part of one team.  This team includes your primary Cardiologist (physician) and Advanced Practice Providers or APPs (Physician Assistants and Nurse Practitioners) who all work together to provide you with the care you need, when you need it.  Your next appointment:  3 month(s)  Provider:  Caron Poser, MD    We recommend signing up for the patient portal called MyChart.  Sign up information is provided on this After Visit Summary.  MyChart is used to connect with patients for Virtual Visits (Telemedicine).  Patients are able to view lab/test results, encounter notes, upcoming appointments, etc.  Non-urgent messages can be sent to your provider as well.   To learn more about what you can do with MyChart, go to [email or web address].

## 2024-04-19 ENCOUNTER — Ambulatory Visit: Payer: Self-pay

## 2024-04-19 LAB — BASIC METABOLIC PANEL WITH GFR
BUN/Creatinine Ratio: 18 (ref 9–20)
BUN: 19 mg/dL (ref 6–24)
CO2: 18 mmol/L — ABNORMAL LOW (ref 20–29)
Calcium: 9.9 mg/dL (ref 8.7–10.2)
Chloride: 105 mmol/L (ref 96–106)
Creatinine, Ser: 1.04 mg/dL (ref 0.76–1.27)
Glucose: 85 mg/dL (ref 70–99)
Potassium: 4.5 mmol/L (ref 3.5–5.2)
Sodium: 141 mmol/L (ref 134–144)
eGFR: 85 mL/min/1.73 (ref >59)

## 2024-04-19 LAB — THYROID PANEL WITH TSH
Free Thyroxine Index: 1.6 (ref 1.2–4.9)
T3 Uptake Ratio: 27 % (ref 24–39)
T4, Total: 5.8 ug/dL (ref 4.5–12.0)
TSH: 0.642 u[IU]/mL (ref 0.450–4.500)

## 2024-05-07 DIAGNOSIS — R079 Chest pain, unspecified: Secondary | ICD-10-CM | POA: Diagnosis not present

## 2024-05-09 DIAGNOSIS — R001 Bradycardia, unspecified: Secondary | ICD-10-CM | POA: Diagnosis not present

## 2024-05-09 DIAGNOSIS — R079 Chest pain, unspecified: Secondary | ICD-10-CM

## 2024-05-23 ENCOUNTER — Ambulatory Visit: Payer: Self-pay

## 2024-05-23 ENCOUNTER — Ambulatory Visit

## 2024-05-23 DIAGNOSIS — R001 Bradycardia, unspecified: Secondary | ICD-10-CM | POA: Diagnosis not present

## 2024-05-23 DIAGNOSIS — R079 Chest pain, unspecified: Secondary | ICD-10-CM

## 2024-05-23 LAB — ECHOCARDIOGRAM COMPLETE
AR max vel: 2.33 cm2
AV Area VTI: 2.36 cm2
AV Area mean vel: 2.31 cm2
AV Mean grad: 5 mmHg
AV Peak grad: 8.2 mmHg
Ao pk vel: 1.43 m/s
Area-P 1/2: 3.37 cm2
S' Lateral: 2.8 cm

## 2024-05-26 ENCOUNTER — Encounter: Payer: Self-pay | Admitting: *Deleted

## 2024-07-18 ENCOUNTER — Ambulatory Visit: Payer: Self-pay
